# Patient Record
Sex: Male | Born: 1955 | Race: White | Hispanic: No | Marital: Married | Smoking: Never smoker
Health system: Southern US, Community
[De-identification: ages and names within clinical notes are randomized; demographics above are authoritative.]

## PROBLEM LIST (undated history)

## (undated) DIAGNOSIS — K222 Esophageal obstruction: Secondary | ICD-10-CM

## (undated) DIAGNOSIS — N200 Calculus of kidney: Secondary | ICD-10-CM

## (undated) DIAGNOSIS — I10 Essential (primary) hypertension: Secondary | ICD-10-CM

## (undated) DIAGNOSIS — E785 Hyperlipidemia, unspecified: Secondary | ICD-10-CM

## (undated) HISTORY — PX: COLONOSCOPY: SHX174

## (undated) HISTORY — PX: ESOPHAGOGASTRODUODENOSCOPY: SHX1529

---

## 2007-07-25 ENCOUNTER — Ambulatory Visit: Payer: Self-pay | Admitting: Gastroenterology

## 2010-09-08 ENCOUNTER — Ambulatory Visit: Payer: Self-pay | Admitting: Gastroenterology

## 2011-06-24 ENCOUNTER — Ambulatory Visit: Payer: Self-pay | Admitting: General Practice

## 2012-09-05 ENCOUNTER — Ambulatory Visit: Payer: Self-pay | Admitting: General Practice

## 2012-09-05 IMAGING — CR DG CHEST 2V
1 series · 2 of 2 positions shown · non-contrast
Comparison: none

REASON FOR EXAM: cough fax results [PHONE_NUMBER]
COMMENTS:

[Series 1: pa · 0.17mm/px · 2 of 2 slices shown]
[im 1/2]
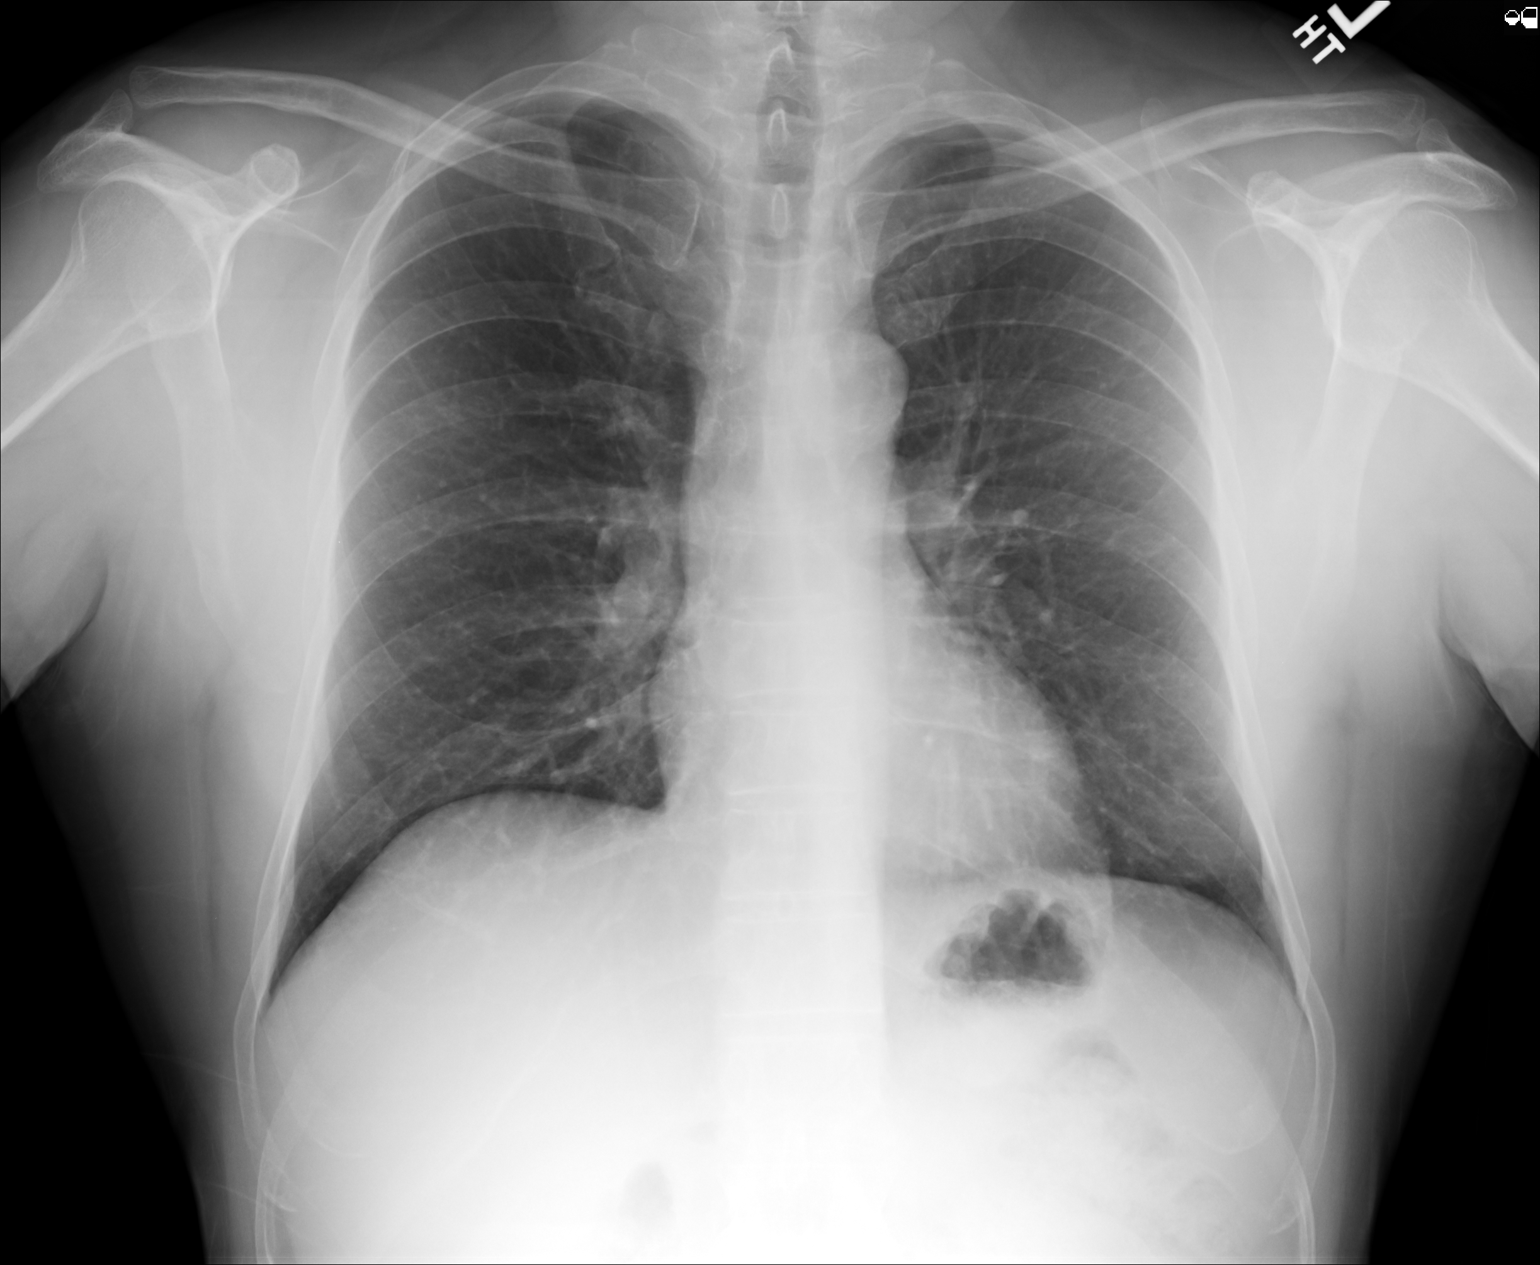
[im 2/2]
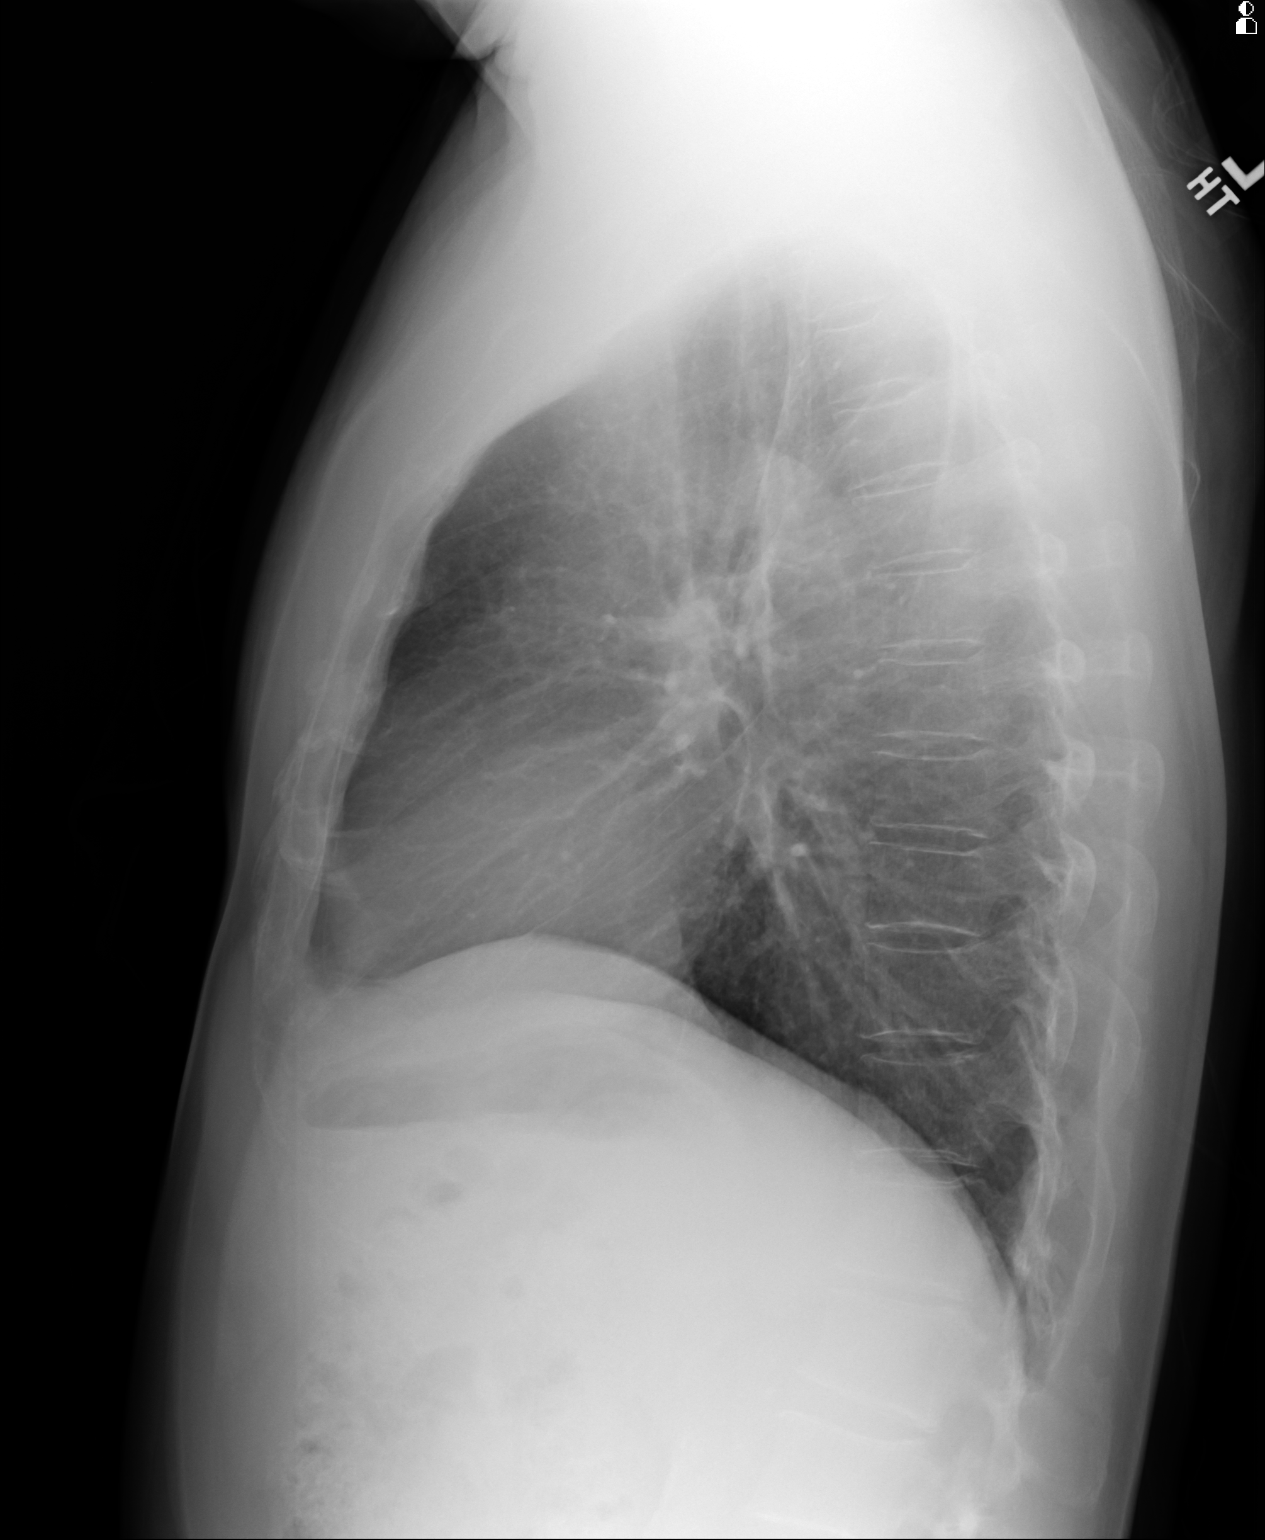

[2 of 2 positions shown; findings below may reference images not displayed]

PROCEDURE:     KDR - KDXR CHEST PA (OR AP) AND LAT  - [DATE]  [DATE]

RESULT:     Comparison is made to the study [DATE].

The lungs are adequately inflated and clear. The cardiac silhouette is
normal in size. The mediastinum is normal in width. There is no pleural
effusion or pneumothorax. The bony thorax exhibits no acute abnormality.
IMPRESSION: There is no evidence of acute cardiopulmonary abnormality.

[REDACTED]

## 2013-01-09 ENCOUNTER — Ambulatory Visit: Payer: Self-pay | Admitting: Otolaryngology

## 2013-01-18 ENCOUNTER — Ambulatory Visit: Payer: Self-pay | Admitting: Gastroenterology

## 2013-05-11 ENCOUNTER — Emergency Department: Payer: Self-pay | Admitting: General Practice

## 2014-03-23 DIAGNOSIS — M7712 Lateral epicondylitis, left elbow: Secondary | ICD-10-CM | POA: Insufficient documentation

## 2015-01-07 DIAGNOSIS — R519 Headache, unspecified: Secondary | ICD-10-CM | POA: Insufficient documentation

## 2015-07-30 ENCOUNTER — Telehealth: Payer: Self-pay | Admitting: Physician Assistant

## 2015-07-30 NOTE — Telephone Encounter (Signed)
Not sure which medication he is talking about.  Flonase or gemfibrizol?

## 2015-09-02 ENCOUNTER — Other Ambulatory Visit: Payer: Self-pay

## 2015-09-02 DIAGNOSIS — Z299 Encounter for prophylactic measures, unspecified: Secondary | ICD-10-CM

## 2015-09-02 NOTE — Progress Notes (Signed)
Patient came in to have blood test per Dr. Sampson GoonFitzgerald order.  Blood was collected in the left arm without any incident.  Pt wants results faxed to Dr. Sampson GoonFitzgerald when they are finalized.

## 2015-09-03 LAB — CMP12+LP+TP+TSH+6AC+PSA+CBC…
ALBUMIN: 4.7 g/dL (ref 3.5–5.5)
ALT: 12 IU/L (ref 0–44)
AST: 17 IU/L (ref 0–40)
Albumin/Globulin Ratio: 2.1 (ref 1.1–2.5)
Alkaline Phosphatase: 80 IU/L (ref 39–117)
BASOS: 0 %
BUN/Creatinine Ratio: 22 — ABNORMAL HIGH (ref 9–20)
BUN: 20 mg/dL (ref 6–24)
Basophils Absolute: 0 10*3/uL (ref 0.0–0.2)
Bilirubin Total: 0.4 mg/dL (ref 0.0–1.2)
CALCIUM: 10.3 mg/dL — AB (ref 8.7–10.2)
CHOLESTEROL TOTAL: 167 mg/dL (ref 100–199)
Chloride: 99 mmol/L (ref 97–106)
Chol/HDL Ratio: 4.2 ratio units (ref 0.0–5.0)
Creatinine, Ser: 0.92 mg/dL (ref 0.76–1.27)
EOS (ABSOLUTE): 0.4 10*3/uL (ref 0.0–0.4)
ESTIMATED CHD RISK: 0.8 times avg. (ref 0.0–1.0)
Eos: 6 %
FREE THYROXINE INDEX: 1.8 (ref 1.2–4.9)
GFR calc Af Amer: 106 mL/min/{1.73_m2} (ref >59)
GFR calc non Af Amer: 92 mL/min/{1.73_m2} (ref >59)
GGT: 21 IU/L (ref 0–65)
GLOBULIN, TOTAL: 2.2 g/dL (ref 1.5–4.5)
Glucose: 93 mg/dL (ref 65–99)
HDL: 40 mg/dL (ref >39)
HEMATOCRIT: 42.1 % (ref 37.5–51.0)
Hemoglobin: 14.1 g/dL (ref 12.6–17.7)
IMMATURE GRANULOCYTES: 0 %
Immature Grans (Abs): 0 10*3/uL (ref 0.0–0.1)
Iron: 130 ug/dL (ref 38–169)
LDH: 144 IU/L (ref 121–224)
LDL Calculated: 108 mg/dL — ABNORMAL HIGH (ref 0–99)
LYMPHS ABS: 1.7 10*3/uL (ref 0.7–3.1)
Lymphs: 29 %
MCH: 30.3 pg (ref 26.6–33.0)
MCHC: 33.5 g/dL (ref 31.5–35.7)
MCV: 90 fL (ref 79–97)
MONOS ABS: 0.4 10*3/uL (ref 0.1–0.9)
Monocytes: 7 %
NEUTROS ABS: 3.3 10*3/uL (ref 1.4–7.0)
NEUTROS PCT: 58 %
PHOSPHORUS: 4.1 mg/dL (ref 2.5–4.5)
POTASSIUM: 5.5 mmol/L — AB (ref 3.5–5.2)
PROSTATE SPECIFIC AG, SERUM: 0.7 ng/mL (ref 0.0–4.0)
Platelets: 436 10*3/uL — ABNORMAL HIGH (ref 150–379)
RBC: 4.66 x10E6/uL (ref 4.14–5.80)
RDW: 13.6 % (ref 12.3–15.4)
SODIUM: 139 mmol/L (ref 136–144)
T3 Uptake Ratio: 26 % (ref 24–39)
T4 TOTAL: 7 ug/dL (ref 4.5–12.0)
TOTAL PROTEIN: 6.9 g/dL (ref 6.0–8.5)
TRIGLYCERIDES: 93 mg/dL (ref 0–149)
TSH: 1.03 u[IU]/mL (ref 0.450–4.500)
Uric Acid: 7.5 mg/dL (ref 3.7–8.6)
VLDL Cholesterol Cal: 19 mg/dL (ref 5–40)
WBC: 5.7 10*3/uL (ref 3.4–10.8)

## 2015-09-03 LAB — HEPATITIS C ANTIBODY (REFLEX)

## 2015-09-03 LAB — HCV COMMENT:

## 2015-09-06 NOTE — Progress Notes (Signed)
Lab results were faxed to Dr[facility] Sampson [facility]oonFitzgerald @ [facility] per patient's request.

## 2015-09-06 NOTE — Progress Notes (Signed)
Lab results were faxed to Dr. Sampson GoonFitzgerald per patient's request.

## 2015-09-17 ENCOUNTER — Other Ambulatory Visit: Payer: Self-pay

## 2015-09-17 DIAGNOSIS — E875 Hyperkalemia: Secondary | ICD-10-CM

## 2015-09-17 NOTE — Progress Notes (Signed)
Patient came in to have blood drawn per Dr. Sampson [facility]oonFitzgerald at [facility].  Blood was drawn from right arm without any incident.

## 2015-09-18 LAB — POTASSIUM: Potassium: 5.1 mmol/L (ref 3.5–5.2)

## 2015-09-18 LAB — CALCIUM: CALCIUM: 9.7 mg/dL (ref 8.7–10.2)

## 2015-10-09 ENCOUNTER — Encounter: Payer: Self-pay | Admitting: *Deleted

## 2015-10-10 ENCOUNTER — Ambulatory Visit: Payer: PRIVATE HEALTH INSURANCE | Admitting: Certified Registered Nurse Anesthetist

## 2015-10-10 ENCOUNTER — Encounter
Admission: RE | Disposition: A | Discharge status: Home / Self Care | Payer: Self-pay | Source: Ambulatory Visit | Attending: Gastroenterology

## 2015-10-10 ENCOUNTER — Ambulatory Visit
Admission: RE | Admit: 2015-10-10 | Discharge: 2015-10-10 | Disposition: A | Discharge status: Home / Self Care | Payer: PRIVATE HEALTH INSURANCE | Source: Ambulatory Visit | Attending: Gastroenterology | Admitting: Gastroenterology

## 2015-10-10 DIAGNOSIS — E785 Hyperlipidemia, unspecified: Secondary | ICD-10-CM | POA: Insufficient documentation

## 2015-10-10 DIAGNOSIS — Z7982 Long term (current) use of aspirin: Secondary | ICD-10-CM | POA: Insufficient documentation

## 2015-10-10 DIAGNOSIS — R1310 Dysphagia, unspecified: Secondary | ICD-10-CM | POA: Insufficient documentation

## 2015-10-10 DIAGNOSIS — Z87442 Personal history of urinary calculi: Secondary | ICD-10-CM | POA: Insufficient documentation

## 2015-10-10 DIAGNOSIS — Z880 Allergy status to penicillin: Secondary | ICD-10-CM | POA: Diagnosis not present

## 2015-10-10 DIAGNOSIS — I10 Essential (primary) hypertension: Secondary | ICD-10-CM | POA: Diagnosis not present

## 2015-10-10 DIAGNOSIS — Z79899 Other long term (current) drug therapy: Secondary | ICD-10-CM | POA: Insufficient documentation

## 2015-10-10 DIAGNOSIS — Z86010 Personal history of colonic polyps: Secondary | ICD-10-CM | POA: Insufficient documentation

## 2015-10-10 HISTORY — DX: Essential (primary) hypertension: I10

## 2015-10-10 HISTORY — PX: COLONOSCOPY WITH PROPOFOL: SHX5780

## 2015-10-10 HISTORY — DX: Hyperlipidemia, unspecified: E78.5

## 2015-10-10 HISTORY — DX: Calculus of kidney: N20.0

## 2015-10-10 HISTORY — DX: Esophageal obstruction: K22.2

## 2015-10-10 HISTORY — PX: ESOPHAGOGASTRODUODENOSCOPY (EGD) WITH PROPOFOL: SHX5813

## 2015-10-10 SURGERY — COLONOSCOPY WITH PROPOFOL
Anesthesia: General

## 2015-10-10 MED ORDER — MIDAZOLAM HCL 2 MG/2ML IJ SOLN
INTRAMUSCULAR | Status: DC | PRN
  Administered 2015-10-10: 1 mg via INTRAVENOUS

## 2015-10-10 MED ORDER — SODIUM CHLORIDE 0.9 % IV SOLN
INTRAVENOUS | Status: DC
  Administered 2015-10-10: 08:00:00 via INTRAVENOUS

## 2015-10-10 MED ORDER — PROPOFOL 10 MG/ML IV BOLUS
INTRAVENOUS | Status: DC | PRN
  Administered 2015-10-10 (×3): 30 mg via INTRAVENOUS

## 2015-10-10 MED ORDER — LIDOCAINE HCL (CARDIAC) 20 MG/ML IV SOLN
INTRAVENOUS | Status: DC | PRN
  Administered 2015-10-10: 60 mg via INTRAVENOUS

## 2015-10-10 MED ORDER — GLYCOPYRROLATE 0.2 MG/ML IJ SOLN
INTRAMUSCULAR | Status: DC | PRN
  Administered 2015-10-10: 0.2 mg via INTRAVENOUS

## 2015-10-10 MED ORDER — PROPOFOL 500 MG/50ML IV EMUL
INTRAVENOUS | Status: DC | PRN
  Administered 2015-10-10: 120 ug/kg/min via INTRAVENOUS

## 2015-10-10 MED ORDER — SODIUM CHLORIDE 0.9 % IV SOLN: INTRAVENOUS | Status: DC

## 2015-10-10 NOTE — H&P (Signed)
    Primary Care Physician:  Clydie BraunFITZGERALD, DAVID, MD Primary Gastroenterologist:  Dr. Bluford Kaufmannh  Pre-Procedure History & Physical: HPI:  Ryan Bonilla is a 59 y.o. male is here for an EGD/colonoscopy.   Past Medical History  Diagnosis Date  . Esophageal stricture   . Hyperlipidemia   . Hypertension   . Kidney stones     Past Surgical History  Procedure Laterality Date  . Colonoscopy    . Esophagogastroduodenoscopy      Prior to Admission medications   Medication Sig Start Date End Date Taking? Authorizing Provider  aspirin 81 MG tablet Take 81 mg by mouth daily.   Yes Historical Provider, MD  atorvastatin (LIPITOR) 10 MG tablet Take 10 mg by mouth daily.   Yes Historical Provider, MD  fexofenadine (ALLEGRA) 180 MG tablet Take 180 mg by mouth daily.   Yes Historical Provider, MD  gemfibrozil (LOPID) 600 MG tablet Take 600 mg by mouth 2 (two) times daily before a meal.   Yes Historical Provider, MD  lisinopril (PRINIVIL,ZESTRIL) 10 MG tablet Take 10 mg by mouth daily.   Yes Historical Provider, MD    Allergies as of 09/27/2015  . (Not on File)    No family history on file.  Social History   Social History  . Marital Status: Married    Spouse Name: N/A  . Number of Children: N/A  . Years of Education: N/A   Occupational History  . Not on file.   Social History Main Topics  . Smoking status: Not on file  . Smokeless tobacco: Not on file  . Alcohol Use: Not on file  . Drug Use: Not on file  . Sexual Activity: Not on file   Other Topics Concern  . Not on file   Social History Narrative  . No narrative on file    Review of Systems: See HPI, otherwise negative ROS  Physical Exam: BP 130/80 mmHg  Pulse 93  Temp(Src) 97.6 F (36.4 C) (Tympanic)  Resp 18  Ht 5\' 6"  (1.676 m)  Wt 69.854 kg (154 lb)  BMI 24.87 kg/m2  SpO2 100% General:   Alert,  pleasant and cooperative in NAD Head:  Normocephalic and atraumatic. Neck:  Supple; no masses or thyromegaly. Lungs:   Clear throughout to auscultation.    Heart:  Regular rate and rhythm. Abdomen:  Soft, nontender and nondistended. Normal bowel sounds, without guarding, and without rebound.   Neurologic:  Alert and  oriented x4;  grossly normal neurologically.  Impression/Plan: Ryan Bonilla is here for an EGD/colonoscopy to be performed for personal hx of colon polyps, dysphagia Risks, benefits, limitations, and alternatives regarding  EGD/colonoscopy have been reviewed with the patient.  Questions have been answered.  All parties agreeable.   Ryan Bonilla, Ezzard StandingPAUL Y, MD  10/10/2015, 8:43 AM

## 2015-10-10 NOTE — Transfer of Care (Signed)
Immediate Anesthesia Transfer of Care Note  Patient: Ryan Bonilla  Procedure(s) Performed: Procedure(s): COLONOSCOPY WITH PROPOFOL (N/A) ESOPHAGOGASTRODUODENOSCOPY (EGD) WITH PROPOFOL (N/A)  Patient Location: PACU  Anesthesia Type:General  Level of Consciousness: sedated  Airway & Oxygen Therapy: Patient Spontanous Breathing and Patient connected to nasal cannula oxygen  Post-op Assessment:   Post vital signs: Reviewed and stable  Last Vitals:  Filed Vitals:   10/10/15 0751 10/10/15 0921  BP: 130/80 98/64  Pulse: 93 107  Temp: 36.4 C 35.8 C  Resp: 18 19    Complications: No apparent anesthesia complications

## 2015-10-10 NOTE — Anesthesia Postprocedure Evaluation (Signed)
Anesthesia Post Note  Patient: Ardeth SportsmanJames C Oyen  Procedure(s) Performed: Procedure(s) (LRB): COLONOSCOPY WITH PROPOFOL (N/A) ESOPHAGOGASTRODUODENOSCOPY (EGD) WITH PROPOFOL (N/A)  Patient location during evaluation: PACU Anesthesia Type: General Level of consciousness: awake and alert Pain management: satisfactory to patient Vital Signs Assessment: post-procedure vital signs reviewed and stable Respiratory status: nonlabored ventilation Cardiovascular status: stable Anesthetic complications: no    Last Vitals:  Filed Vitals:   10/10/15 0921 10/10/15 0922  BP: 98/64 98/64  Pulse: 107 106  Temp: 35.8 C 35.8 C  Resp: 19 19    Last Pain: There were no vitals filed for this visit.               VAN STAVEREN,Kearsten Ginther

## 2015-10-10 NOTE — Anesthesia Preprocedure Evaluation (Signed)
Anesthesia Evaluation  Patient identified by MRN, date of birth, ID band Patient awake    Reviewed: Allergy & Precautions, NPO status , Patient's Chart, lab work & pertinent test results  Airway Mallampati: II       Dental  (+) Teeth Intact   Pulmonary neg pulmonary ROS,    breath sounds clear to auscultation       Cardiovascular Exercise Tolerance: Good hypertension, Pt. on medications  Rhythm:Regular     Neuro/Psych    GI/Hepatic negative GI ROS, Neg liver ROS,   Endo/Other  negative endocrine ROS  Renal/GU negative Renal ROS     Musculoskeletal   Abdominal Normal abdominal exam  (+)   Peds  Hematology   Anesthesia Other Findings   Reproductive/Obstetrics                             Anesthesia Physical Anesthesia Plan  ASA: II  Anesthesia Plan: General   Post-op Pain Management:    Induction: Intravenous  Airway Management Planned: Nasal Cannula  Additional Equipment:   Intra-op Plan:   Post-operative Plan:   Informed Consent: I have reviewed the patients History and Physical, chart, labs and discussed the procedure including the risks, benefits and alternatives for the proposed anesthesia with the patient or authorized representative who has indicated his/her understanding and acceptance.     Plan Discussed with: CRNA  Anesthesia Plan Comments:         Anesthesia Quick Evaluation

## 2015-10-10 NOTE — Op Note (Signed)
[facility] Gastroenterology Patient Name: Ryan Bonilla Procedure Date: 10/10/2015 8:56 AM MRN: [id number] Account #: [id number] Date of Birth: 02-10-1956 Admit Type: Outpatient Age: 59 Room: [facility] ENDO ROOM 4 Gender: Male Note Status: Finalized Procedure:         Upper GI endoscopy Indications:       Dysphagia Providers:         Ezzard Standing. Bluford Kaufmann, MD Referring MD:      Clydie Braun (Referring MD) Medicines:         Sedation Required Anesthesia Staff Assistance, Monitored                     Anesthesia Care Complications:     No immediate complications. Procedure:         Pre-Anesthesia Assessment:                    - Prior to the procedure, a History and Physical was                     performed, and patient medications, allergies and                     sensitivities were reviewed. The patient's tolerance of                     previous anesthesia was reviewed.                    - The risks and benefits of the procedure and the sedation                     options and risks were discussed with the patient. All                     questions were answered and informed consent was obtained.                    - After reviewing the risks and benefits, the patient was                     deemed in satisfactory condition to undergo the procedure.                    After obtaining informed consent, the endoscope was passed                     under direct vision. Throughout the procedure, the                     patient's blood pressure, pulse, and oxygen saturations                     were monitored continuously. The Olympus GIF-160 endoscope                     (S#. [id number]) was introduced through the mouth, and                     advanced to the second part of duodenum. The upper GI                     endoscopy was accomplished without difficulty. The patient  tolerated the procedure well. Findings:      No endoscopic abnormality was evident  in the esophagus to explain the       patient's complaint of dysphagia. It was decided, however, to proceed       with dilation of the entire esophagus. The scope was withdrawn. Dilation       was performed with a Maloney dilator with mild resistance at 54 Fr.      The entire examined stomach was normal.      The examined duodenum was normal. Impression:        - No endoscopic esophageal abnormality to explain                     patient's dysphagia. Esophagus dilated. Dilated.                    - Normal stomach.                    - Normal examined duodenum.                    - No specimens collected. Recommendation:    - Discharge patient to home.                    - Observe patient's clinical course.                    - The findings and recommendations were discussed with the                     patient. Procedure Code(s): --- Professional ---                    [phone], Esophagogastroduodenoscopy, flexible, transoral;                     diagnostic, including collection of specimen(s) by                     brushing or washing, when performed (separate procedure)                    43450, Dilation of esophagus, by unguided sound or bougie,                     single or multiple passes Diagnosis Code(s): --- Professional ---                    R13.10, Dysphagia, unspecified CPT copyright 2014 American Medical Association. All rights reserved. The codes documented in this report are preliminary and upon coder review may  be revised to meet current compliance requirements. Wallace CullensPaul Y Willowdean Luhmann, MD 10/10/2015 9:05:55 AM This report has been signed electronically. Number of Addenda: 0 Note Initiated On: 10/10/2015 8:56 AM      [facility]

## 2015-10-10 NOTE — Op Note (Signed)
[facility] Gastroenterology Patient Name: Ryan SodaJames Parrow Procedure Date: 10/10/2015 8:56 AM MRN: [id number] Account #: [id number] Date of Birth: 08-Jan-1956 Admit Type: Outpatient Age: 59 [facility] ENDO ROOM 4 Gender: Male Note Status: Finalized Procedure:         Colonoscopy Indications:       Personal history of colonic polyps Providers:         Ezzard StandingPaul Y. Bluford Kaufmannh, MD Referring MD:      Teena Iraniavid M. Sampson GoonFitzgerald, MD (Referring MD) Medicines:         Monitored Anesthesia Care Complications:     No immediate complications. Procedure:         Pre-Anesthesia Assessment:                    - Prior to the procedure, a History and Physical was                     performed, and patient medications, allergies and                     sensitivities were reviewed. The patient's tolerance of                     previous anesthesia was reviewed.                    - The risks and benefits of the procedure and the sedation                     options and risks were discussed with the patient. All                     questions were answered and informed consent was obtained.                    - After reviewing the risks and benefits, the patient was                     deemed in satisfactory condition to undergo the procedure.                    After obtaining informed consent, the colonoscope was                     passed under direct vision. Throughout the procedure, the                     patient's blood pressure, pulse, and oxygen saturations                     were monitored continuously. The Olympus PCF-H180AL                     colonoscope ( S#: [id number] ) was introduced through the                     anus and advanced to the the cecum, identified by                     appendiceal orifice and ileocecal valve. The colonoscopy                     was performed without difficulty. The patient tolerated  the procedure well. The quality of the bowel preparation                      was good. Findings:      The colon (entire examined portion) appeared normal. Impression:        - The entire examined colon is normal.                    - No specimens collected. Recommendation:    - Discharge patient to home.                    - Repeat colonoscopy in 5 years for surveillance.                    - The findings and recommendations were discussed with the                     patient. Procedure Code(s): --- Professional ---                    [phone], Colonoscopy, flexible; diagnostic, including                     collection of specimen(s) by brushing or washing, when                     performed (separate procedure) Diagnosis Code(s): --- Professional ---                    Z86.010, Personal history of colonic polyps CPT copyright 2014 American Medical Association. All rights reserved. The codes documented in this report are preliminary and upon coder review may  be revised to meet current compliance requirements. Wallace Cullens, MD 10/10/2015 9:21:03 AM This report has been signed electronically. Number of Addenda: 0 Note Initiated On: 10/10/2015 8:56 AM Scope Withdrawal Time: 0 hours 8 minutes 39 seconds  Total Procedure Duration: 0 hours 10 minutes 44 seconds       [facility]

## 2015-10-12 ENCOUNTER — Encounter: Payer: Self-pay | Admitting: Gastroenterology

## 2015-11-07 ENCOUNTER — Ambulatory Visit: Payer: Self-pay | Admitting: Physician Assistant

## 2015-11-07 ENCOUNTER — Encounter: Payer: Self-pay | Admitting: Physician Assistant

## 2015-11-07 VITALS — BP 135/70 | HR 95 | Temp 97.9°F

## 2015-11-07 DIAGNOSIS — S60459A Superficial foreign body of unspecified finger, initial encounter: Secondary | ICD-10-CM

## 2015-11-07 MED ORDER — SULFAMETHOXAZOLE-TRIMETHOPRIM 800-160 MG PO TABS: 1.0000 | ORAL_TABLET | Freq: Two times a day (BID) | ORAL | Status: DC

## 2015-11-07 NOTE — Progress Notes (Signed)
S: c/o having a splinter in thumb, not sure its still in there, pulled a piece out, finger has been sore since then, ?if swelling, no drainage  O: vitals wnl, nad, left thumb has puncture wound along edge of cuticle, no fb noted, no drainage, n/v intact  A: foreign body left thumb, ?infected vs retained fb  P: septra ds 1 po bid x 7d, warm water soaks, if worsening will send to ortho

## 2015-12-13 ENCOUNTER — Encounter: Payer: Self-pay | Admitting: Physician Assistant

## 2015-12-13 ENCOUNTER — Ambulatory Visit: Payer: Self-pay | Admitting: Physician Assistant

## 2015-12-13 VITALS — BP 121/70 | HR 96 | Temp 97.5°F

## 2015-12-13 DIAGNOSIS — J0100 Acute maxillary sinusitis, unspecified: Secondary | ICD-10-CM

## 2015-12-13 MED ORDER — FEXOFENADINE-PSEUDOEPHED ER 60-120 MG PO TB12: 1.0000 | ORAL_TABLET | Freq: Two times a day (BID) | ORAL | Status: DC

## 2015-12-13 MED ORDER — SULFAMETHOXAZOLE-TRIMETHOPRIM 800-160 MG PO TABS: 1.0000 | ORAL_TABLET | Freq: Two times a day (BID) | ORAL | Status: DC

## 2015-12-13 NOTE — Progress Notes (Signed)
   Subjective:Sinus problems    Patient ID: Ryan Bonilla, male    DOB: 1956-04-08, 60 y.o.   MRN: [id number]  HPI Patient c/o one week of facial and ear pain. Intermitting post nasal drainage. Low grade fever and cough.Denies nausea, vomiting, or diarrhea. No palliative measures for compliant.    Review of Systems    Hypertension and Hyperlipedma. Objective:   Physical Exam Bilateral maxillary guarding, edematous bilateral TM, and post nasal drainage. Bilateral nasal turbinates. Neck supple without adenopathy, Lungs CTA, and Heart RRR.       Assessment & Plan:maxillary sinusitis Bactrim DS and Allergra-D.  Follow up as needed.

## 2016-07-13 ENCOUNTER — Ambulatory Visit: Payer: Self-pay | Admitting: Physician Assistant

## 2016-07-13 ENCOUNTER — Encounter: Payer: Self-pay | Admitting: Physician Assistant

## 2016-07-13 VITALS — BP 121/73 | HR 121 | Temp 99.3°F

## 2016-07-13 DIAGNOSIS — J09X2 Influenza due to identified novel influenza A virus with other respiratory manifestations: Secondary | ICD-10-CM

## 2016-07-13 LAB — POCT INFLUENZA A/B
INFLUENZA A, POC: POSITIVE — AB
Influenza B, POC: NEGATIVE

## 2016-07-13 MED ORDER — HYDROCOD POLST-CPM POLST ER 10-8 MG/5ML PO SUER
5.0000 mL | Freq: Two times a day (BID) | ORAL | 0 refills | Status: DC | PRN
Start: 2016-07-13 — End: 2019-12-18

## 2016-07-13 MED ORDER — OSELTAMIVIR PHOSPHATE 75 MG PO CAPS: 75.0000 mg | ORAL_CAPSULE | Freq: Two times a day (BID) | ORAL | 0 refills | Status: DC

## 2016-07-13 NOTE — Progress Notes (Signed)
S: C/o runny nose and congestion with dry cough for 2-3 days, + fever, chills, denies cp/sob, v/d;  cough is sporadic, keeping him awake at night, aches down to his bones  Using otc meds: robitussin  O: PE: vitals w elevated temp at 99.3, , nad,  perrl eomi, normocephalic, tms dull, nasal mucosa red and swollen, throat injected, neck supple no lymph, lungs c t a, cv rrr, neuro intact, flu swab +A  A:  Acute influenza A   P: drink fluids, continue regular meds , use otc meds of choice, return if not improving in 5 days, return earlier if worsening , tamiflu 75mg  bid x 5, tussionex, 150ml nr, also gave rx for family members April and [place] Mcgranahan to take preventive

## 2016-08-17 ENCOUNTER — Other Ambulatory Visit: Payer: Self-pay

## 2016-08-17 DIAGNOSIS — Z299 Encounter for prophylactic measures, unspecified: Secondary | ICD-10-CM

## 2016-08-17 NOTE — Progress Notes (Signed)
Patient came in to have blood drawn for testing per Dr[facility] Sampson [facility]Fitzgerald [facility] orders.

## 2016-08-18 LAB — CMP12+LP+TP+TSH+6AC+CBC/D/PLT
A/G RATIO: 2.3 — AB (ref 1.2–2.2)
ALBUMIN: 4.5 g/dL (ref 3.5–5.5)
ALK PHOS: 79 IU/L (ref 39–117)
ALT: 13 IU/L (ref 0–44)
AST: 13 IU/L (ref 0–40)
BASOS ABS: 0 10*3/uL (ref 0.0–0.2)
BILIRUBIN TOTAL: 0.4 mg/dL (ref 0.0–1.2)
BUN / CREAT RATIO: 21 — AB (ref 9–20)
BUN: 15 mg/dL (ref 6–24)
Basos: 1 %
CHOLESTEROL TOTAL: 161 mg/dL (ref 100–199)
Calcium: 9.6 mg/dL (ref 8.7–10.2)
Chloride: 100 mmol/L (ref 96–106)
Chol/HDL Ratio: 4.1 ratio units (ref 0.0–5.0)
Creatinine, Ser: 0.72 mg/dL — ABNORMAL LOW (ref 0.76–1.27)
EOS (ABSOLUTE): 0.7 10*3/uL — ABNORMAL HIGH (ref 0.0–0.4)
EOS: 8 %
Estimated CHD Risk: 0.8 times avg. (ref 0.0–1.0)
FREE THYROXINE INDEX: 1.5 (ref 1.2–4.9)
GFR calc Af Amer: 119 mL/min/{1.73_m2} (ref >59)
GFR calc non Af Amer: 103 mL/min/{1.73_m2} (ref >59)
GGT: 12 IU/L (ref 0–65)
Globulin, Total: 2 g/dL (ref 1.5–4.5)
Glucose: 92 mg/dL (ref 65–99)
HDL: 39 mg/dL — AB (ref >39)
HEMOGLOBIN: 13.7 g/dL (ref 12.6–17.7)
Hematocrit: 42 % (ref 37.5–51.0)
IMMATURE GRANS (ABS): 0 10*3/uL (ref 0.0–0.1)
IMMATURE GRANULOCYTES: 0 %
IRON: 59 ug/dL (ref 38–169)
LDH: 146 IU/L (ref 121–224)
LDL CALC: 99 mg/dL (ref 0–99)
LYMPHS ABS: 2.3 10*3/uL (ref 0.7–3.1)
LYMPHS: 28 %
MCH: 29.3 pg (ref 26.6–33.0)
MCHC: 32.6 g/dL (ref 31.5–35.7)
MCV: 90 fL (ref 79–97)
MONOCYTES: 7 %
Monocytes Absolute: 0.6 10*3/uL (ref 0.1–0.9)
NEUTROS PCT: 56 %
Neutrophils Absolute: 4.5 10*3/uL (ref 1.4–7.0)
PLATELETS: 395 10*3/uL — AB (ref 150–379)
Phosphorus: 4.4 mg/dL (ref 2.5–4.5)
Potassium: 5 mmol/L (ref 3.5–5.2)
RBC: 4.67 x10E6/uL (ref 4.14–5.80)
RDW: 14.3 % (ref 12.3–15.4)
Sodium: 138 mmol/L (ref 134–144)
T3 UPTAKE RATIO: 23 % — AB (ref 24–39)
T4, Total: 6.7 ug/dL (ref 4.5–12.0)
TOTAL PROTEIN: 6.5 g/dL (ref 6.0–8.5)
TRIGLYCERIDES: 117 mg/dL (ref 0–149)
TSH: 2.57 u[IU]/mL (ref 0.450–4.500)
Uric Acid: 6.2 mg/dL (ref 3.7–8.6)
VLDL CHOLESTEROL CAL: 23 mg/dL (ref 5–40)
WBC: 8.1 10*3/uL (ref 3.4–10.8)

## 2016-08-18 LAB — HGB A1C W/O EAG: Hgb A1c MFr Bld: 6 % — ABNORMAL HIGH (ref 4.8–5.6)

## 2017-02-23 ENCOUNTER — Other Ambulatory Visit: Payer: Self-pay | Admitting: Emergency Medicine

## 2017-02-23 MED ORDER — FLUTICASONE PROPIONATE 50 MCG/ACT NA SUSP: 2.0000 | Freq: Every day | NASAL | 6 refills | Status: DC

## 2017-02-23 NOTE — Telephone Encounter (Signed)
Med refill for flonase approved, rx was sent to [facility]

## 2017-04-20 ENCOUNTER — Ambulatory Visit: Payer: Self-pay | Admitting: Physician Assistant

## 2017-04-20 ENCOUNTER — Encounter: Payer: Self-pay | Admitting: Physician Assistant

## 2017-04-20 VITALS — BP 110/70 | HR 86 | Temp 98.5°F

## 2017-04-20 DIAGNOSIS — Z20818 Contact with and (suspected) exposure to other bacterial communicable diseases: Secondary | ICD-10-CM

## 2017-04-20 MED ORDER — AZITHROMYCIN 250 MG PO TABS: ORAL_TABLET | ORAL | 0 refills | Status: DC

## 2017-04-20 NOTE — Progress Notes (Signed)
S: is going out of town in a few days, his wife was dx with strep throat, he started feeling like he has a scratchy throat this morning, no fever/chills, no sinus drainage, using nasal sprays  O: vitals wnl, nad, tms clear, nasal mucosa wnl, throat wnl, neck supple no lymph, lungs c t a, cv rrr  A: exposure to strep, travel med  P: zpack

## 2017-05-24 ENCOUNTER — Other Ambulatory Visit: Payer: Self-pay | Admitting: Physician Assistant

## 2017-05-24 NOTE — Telephone Encounter (Signed)
Med refill for flonase, pt gets by mail order, 90 day supply with 3 refills

## 2017-08-30 ENCOUNTER — Other Ambulatory Visit: Payer: Self-pay

## 2017-08-30 DIAGNOSIS — Z299 Encounter for prophylactic measures, unspecified: Secondary | ICD-10-CM

## 2017-08-30 NOTE — Progress Notes (Signed)
Patient came in to have blood drawn for testing per Dr. Jarrett AblesFitzgerald's orders.

## 2017-08-31 LAB — CMP12+LP+TP+TSH+6AC+CBC/D/PLT
ALK PHOS: 82 IU/L (ref 39–117)
ALT: 15 IU/L (ref 0–44)
AST: 17 IU/L (ref 0–40)
Albumin/Globulin Ratio: 2 (ref 1.2–2.2)
Albumin: 4.7 g/dL (ref 3.5–5.5)
BASOS ABS: 0 10*3/uL (ref 0.0–0.2)
BUN/Creatinine Ratio: 19 (ref 9–20)
BUN: 17 mg/dL (ref 6–24)
Basos: 0 %
Bilirubin Total: 0.3 mg/dL (ref 0.0–1.2)
CALCIUM: 9.7 mg/dL (ref 8.7–10.2)
CHOL/HDL RATIO: 4.4 ratio (ref 0.0–5.0)
Chloride: 102 mmol/L (ref 96–106)
Cholesterol, Total: 190 mg/dL (ref 100–199)
Creatinine, Ser: 0.9 mg/dL (ref 0.76–1.27)
EOS (ABSOLUTE): 0.6 10*3/uL — AB (ref 0.0–0.4)
Eos: 7 %
Estimated CHD Risk: 0.9 times avg. (ref 0.0–1.0)
Free Thyroxine Index: 1.8 (ref 1.2–4.9)
GFR calc non Af Amer: 93 mL/min/{1.73_m2} (ref >59)
GFR, EST AFRICAN AMERICAN: 108 mL/min/{1.73_m2} (ref >59)
GGT: 20 IU/L (ref 0–65)
GLOBULIN, TOTAL: 2.3 g/dL (ref 1.5–4.5)
Glucose: 97 mg/dL (ref 65–99)
HDL: 43 mg/dL (ref >39)
Hematocrit: 37.2 % — ABNORMAL LOW (ref 37.5–51.0)
Hemoglobin: 12.1 g/dL — ABNORMAL LOW (ref 13.0–17.7)
IMMATURE GRANS (ABS): 0 10*3/uL (ref 0.0–0.1)
Immature Granulocytes: 0 %
Iron: 44 ug/dL (ref 38–169)
LDH: 177 IU/L (ref 121–224)
LDL CALC: 123 mg/dL — AB (ref 0–99)
LYMPHS: 21 %
Lymphocytes Absolute: 1.7 10*3/uL (ref 0.7–3.1)
MCH: 25.7 pg — ABNORMAL LOW (ref 26.6–33.0)
MCHC: 32.5 g/dL (ref 31.5–35.7)
MCV: 79 fL (ref 79–97)
MONOCYTES: 6 %
MONOS ABS: 0.5 10*3/uL (ref 0.1–0.9)
NEUTROS PCT: 66 %
Neutrophils Absolute: 5.3 10*3/uL (ref 1.4–7.0)
PLATELETS: 547 10*3/uL — AB (ref 150–379)
POTASSIUM: 5.2 mmol/L (ref 3.5–5.2)
Phosphorus: 3.7 mg/dL (ref 2.5–4.5)
RBC: 4.7 x10E6/uL (ref 4.14–5.80)
RDW: 15.5 % — AB (ref 12.3–15.4)
Sodium: 139 mmol/L (ref 134–144)
T3 Uptake Ratio: 23 % — ABNORMAL LOW (ref 24–39)
T4, Total: 7.7 ug/dL (ref 4.5–12.0)
TRIGLYCERIDES: 122 mg/dL (ref 0–149)
TSH: 1.47 u[IU]/mL (ref 0.450–4.500)
Total Protein: 7 g/dL (ref 6.0–8.5)
Uric Acid: 7.5 mg/dL (ref 3.7–8.6)
VLDL Cholesterol Cal: 24 mg/dL (ref 5–40)
WBC: 8.1 10*3/uL (ref 3.4–10.8)

## 2017-08-31 LAB — HGB A1C W/O EAG: HEMOGLOBIN A1C: 6.2 % — AB (ref 4.8–5.6)

## 2017-09-13 ENCOUNTER — Other Ambulatory Visit: Payer: Self-pay | Admitting: Physician Assistant

## 2017-09-13 VITALS — BP 125/70 | HR 80 | Temp 98.3°F | Resp 16

## 2017-09-13 DIAGNOSIS — Z299 Encounter for prophylactic measures, unspecified: Secondary | ICD-10-CM

## 2017-09-13 NOTE — Progress Notes (Signed)
Patient came in to have blood drawn for testing per Dr.David Fitzgerald's order.

## 2017-09-14 LAB — CBC WITH DIFFERENTIAL/PLATELET
BASOS ABS: 0 10*3/uL (ref 0.0–0.2)
Basos: 0 %
EOS (ABSOLUTE): 0.4 10*3/uL (ref 0.0–0.4)
Eos: 5 %
Hematocrit: 38.1 % (ref 37.5–51.0)
Hemoglobin: 12 g/dL — ABNORMAL LOW (ref 13.0–17.7)
IMMATURE GRANS (ABS): 0 10*3/uL (ref 0.0–0.1)
IMMATURE GRANULOCYTES: 0 %
LYMPHS: 26 %
Lymphocytes Absolute: 2.1 10*3/uL (ref 0.7–3.1)
MCH: 24.9 pg — ABNORMAL LOW (ref 26.6–33.0)
MCHC: 31.5 g/dL (ref 31.5–35.7)
MCV: 79 fL (ref 79–97)
MONOS ABS: 0.5 10*3/uL (ref 0.1–0.9)
Monocytes: 6 %
NEUTROS PCT: 63 %
Neutrophils Absolute: 4.9 10*3/uL (ref 1.4–7.0)
PLATELETS: 494 10*3/uL — AB (ref 150–379)
RBC: 4.82 x10E6/uL (ref 4.14–5.80)
RDW: 15.3 % (ref 12.3–15.4)
WBC: 7.8 10*3/uL (ref 3.4–10.8)

## 2017-09-14 LAB — HEPATITIS C ANTIBODY (REFLEX)

## 2017-09-14 LAB — FERRITIN: FERRITIN: 8 ng/mL — AB (ref 30–400)

## 2017-09-14 LAB — HCV COMMENT:

## 2017-09-14 LAB — VITAMIN B12: VITAMIN B 12: 161 pg/mL — AB (ref 232–1245)

## 2017-09-14 NOTE — Progress Notes (Signed)
Here is a copy of your patient's labs that were drawn in the employee [facility] 

## 2017-10-20 ENCOUNTER — Ambulatory Visit: Payer: Self-pay | Admitting: Emergency Medicine

## 2017-10-20 DIAGNOSIS — Z4802 Encounter for removal of sutures: Secondary | ICD-10-CM

## 2017-10-20 DIAGNOSIS — Z299 Encounter for prophylactic measures, unspecified: Secondary | ICD-10-CM

## 2017-10-20 NOTE — Progress Notes (Addendum)
Previous note deleted. The note was dictated on the wrong patient.

## 2017-10-20 NOTE — Progress Notes (Addendum)
Note dictated on the wrong patient's chart. Note deleted.

## 2017-10-20 NOTE — Addendum Note (Signed)
Addended by: Catha BrowEACON, Basya Casavant T on: 10/20/2017 02:46 PM   Modules accepted: Orders

## 2017-10-20 NOTE — Progress Notes (Signed)
Patient came in to have blood drawn for testing Molly MaduroRobert Tumey's orders.

## 2017-10-21 LAB — CBC WITH DIFFERENTIAL/PLATELET
BASOS: 0 %
Basophils Absolute: 0 10*3/uL (ref 0.0–0.2)
EOS (ABSOLUTE): 0.4 10*3/uL (ref 0.0–0.4)
EOS: 5 %
HEMATOCRIT: 42.8 % (ref 37.5–51.0)
HEMOGLOBIN: 13.9 g/dL (ref 13.0–17.7)
IMMATURE GRANS (ABS): 0 10*3/uL (ref 0.0–0.1)
IMMATURE GRANULOCYTES: 0 %
LYMPHS: 24 %
Lymphocytes Absolute: 1.8 10*3/uL (ref 0.7–3.1)
MCH: 26.9 pg (ref 26.6–33.0)
MCHC: 32.5 g/dL (ref 31.5–35.7)
MCV: 83 fL (ref 79–97)
Monocytes Absolute: 0.4 10*3/uL (ref 0.1–0.9)
Monocytes: 5 %
NEUTROS PCT: 66 %
Neutrophils Absolute: 4.9 10*3/uL (ref 1.4–7.0)
Platelets: 410 10*3/uL — ABNORMAL HIGH (ref 150–379)
RBC: 5.16 x10E6/uL (ref 4.14–5.80)
RDW: 18.9 % — ABNORMAL HIGH (ref 12.3–15.4)
WBC: 7.5 10*3/uL (ref 3.4–10.8)

## 2017-10-21 LAB — COMPREHENSIVE METABOLIC PANEL
A/G RATIO: 2.1 (ref 1.2–2.2)
ALT: 20 IU/L (ref 0–44)
AST: 13 IU/L (ref 0–40)
Albumin: 4.7 g/dL (ref 3.5–5.5)
Alkaline Phosphatase: 96 IU/L (ref 39–117)
BUN/Creatinine Ratio: 20 (ref 9–20)
BUN: 18 mg/dL (ref 6–24)
Bilirubin Total: <0.2 mg/dL (ref 0.0–1.2)
CALCIUM: 9.9 mg/dL (ref 8.7–10.2)
CO2: 21 mmol/L (ref 20–29)
Chloride: 101 mmol/L (ref 96–106)
Creatinine, Ser: 0.89 mg/dL (ref 0.76–1.27)
GFR, EST AFRICAN AMERICAN: 108 mL/min/{1.73_m2} (ref >59)
GFR, EST NON AFRICAN AMERICAN: 94 mL/min/{1.73_m2} (ref >59)
GLOBULIN, TOTAL: 2.2 g/dL (ref 1.5–4.5)
Glucose: 152 mg/dL — ABNORMAL HIGH (ref 65–99)
POTASSIUM: 5 mmol/L (ref 3.5–5.2)
SODIUM: 139 mmol/L (ref 134–144)
TOTAL PROTEIN: 6.9 g/dL (ref 6.0–8.5)

## 2017-10-25 ENCOUNTER — Other Ambulatory Visit: Payer: Self-pay | Admitting: Internal Medicine

## 2017-10-25 DIAGNOSIS — R1013 Epigastric pain: Secondary | ICD-10-CM

## 2017-10-29 ENCOUNTER — Ambulatory Visit: Payer: PRIVATE HEALTH INSURANCE

## 2017-11-02 ENCOUNTER — Ambulatory Visit
Admission: RE | Admit: 2017-11-02 | Discharge: 2017-11-02 | Disposition: A | Discharge status: Home / Self Care | Payer: Managed Care, Other (non HMO) | Source: Ambulatory Visit | Attending: Internal Medicine | Admitting: Internal Medicine

## 2017-11-02 DIAGNOSIS — R1013 Epigastric pain: Secondary | ICD-10-CM

## 2017-11-04 ENCOUNTER — Ambulatory Visit
Admission: RE | Admit: 2017-11-04 | Discharge: 2017-11-04 | Disposition: A | Discharge status: Home / Self Care | Payer: Managed Care, Other (non HMO) | Source: Ambulatory Visit | Attending: Internal Medicine | Admitting: Internal Medicine

## 2017-11-04 DIAGNOSIS — R1013 Epigastric pain: Secondary | ICD-10-CM | POA: Insufficient documentation

## 2017-11-24 DIAGNOSIS — D500 Iron deficiency anemia secondary to blood loss (chronic): Secondary | ICD-10-CM | POA: Insufficient documentation

## 2017-11-24 DIAGNOSIS — E538 Deficiency of other specified B group vitamins: Secondary | ICD-10-CM | POA: Insufficient documentation

## 2018-02-23 DIAGNOSIS — R9389 Abnormal findings on diagnostic imaging of other specified body structures: Secondary | ICD-10-CM | POA: Insufficient documentation

## 2018-09-13 ENCOUNTER — Emergency Department: Payer: Managed Care, Other (non HMO)

## 2018-09-13 ENCOUNTER — Emergency Department
Admission: EM | Admit: 2018-09-13 | Discharge: 2018-09-13 | Disposition: A | Discharge status: Home / Self Care | Payer: Managed Care, Other (non HMO) | Attending: Emergency Medicine | Admitting: Emergency Medicine

## 2018-09-13 ENCOUNTER — Encounter: Payer: Self-pay | Admitting: Emergency Medicine

## 2018-09-13 ENCOUNTER — Other Ambulatory Visit: Payer: Self-pay

## 2018-09-13 DIAGNOSIS — Z79899 Other long term (current) drug therapy: Secondary | ICD-10-CM | POA: Insufficient documentation

## 2018-09-13 DIAGNOSIS — R079 Chest pain, unspecified: Secondary | ICD-10-CM | POA: Insufficient documentation

## 2018-09-13 DIAGNOSIS — R000 Tachycardia, unspecified: Secondary | ICD-10-CM | POA: Insufficient documentation

## 2018-09-13 DIAGNOSIS — I10 Essential (primary) hypertension: Secondary | ICD-10-CM | POA: Diagnosis not present

## 2018-09-13 DIAGNOSIS — Z7982 Long term (current) use of aspirin: Secondary | ICD-10-CM | POA: Insufficient documentation

## 2018-09-13 LAB — CBC
HCT: 45.6 % (ref 39.0–52.0)
HEMOGLOBIN: 15.7 g/dL (ref 13.0–17.0)
MCH: 30.8 pg (ref 26.0–34.0)
MCHC: 34.4 g/dL (ref 30.0–36.0)
MCV: 89.4 fL (ref 80.0–100.0)
Platelets: 440 10*3/uL — ABNORMAL HIGH (ref 150–400)
RBC: 5.1 MIL/uL (ref 4.22–5.81)
RDW: 13 % (ref 11.5–15.5)
WBC: 7 10*3/uL (ref 4.0–10.5)
nRBC: 0 % (ref 0.0–0.2)

## 2018-09-13 LAB — TROPONIN I: Troponin I: <0.03 ng/mL (ref <0.03)

## 2018-09-13 LAB — BASIC METABOLIC PANEL
ANION GAP: 9 (ref 5–15)
BUN: 17 mg/dL (ref 6–20)
CO2: 25 mmol/L (ref 22–32)
Calcium: 10 mg/dL (ref 8.9–10.3)
Chloride: 103 mmol/L (ref 98–111)
Creatinine, Ser: 0.83 mg/dL (ref 0.61–1.24)
GFR calc Af Amer: >60 mL/min (ref >60)
Glucose, Bld: 100 mg/dL — ABNORMAL HIGH (ref 70–99)
POTASSIUM: 3.7 mmol/L (ref 3.5–5.1)
SODIUM: 137 mmol/L (ref 135–145)

## 2018-09-13 MED ORDER — SODIUM CHLORIDE 0.9 % IV BOLUS
1000.0000 mL | Freq: Once | INTRAVENOUS | Status: AC
  Administered 2018-09-13: 1000 mL via INTRAVENOUS

## 2018-09-13 NOTE — ED Provider Notes (Signed)
[facility] Emergency Department Provider Note  Time seen: 6:29 PM  I have reviewed the triage vital signs and the nursing notes.   HISTORY  Chief Complaint Tachycardia    HPI Ryan Bonilla is a 62 y.o. male with a past medical history of hypertension, hyperlipidemia, esophageal strictures, presents to the emergency department for acute onset of diaphoresis, felt like his blood pressure increased and dizziness.  According to the patient around 4 PM today he had a sudden onset of rapid heart rate, felt dizzy and somewhat diaphoretic.  Denies any chest pain, although states at one point he felt some discomfort, I continued to press him on his issue but he says he never developed any pain in the chest it was more lightheaded and felt like his blood pressure spiked up.  States he [facility] to the jail nurse who checked his blood pressure which was slightly elevated and the heart rate was around 125 and told the patient to come to the emergency department which he did.  Patient denies any discomfort currently, states he is feeling much better, heart rate is currently 98 bpm.  Vitals are otherwise nonrevealing.  Denies any cardiac history, states a negative stress test 2 years ago.   Past Medical History:  Diagnosis Date  . Esophageal stricture   . Hyperlipidemia   . Hypertension   . Kidney stones     There are no active problems to display for this patient.   Past Surgical History:  Procedure Laterality Date  . COLONOSCOPY    . COLONOSCOPY WITH PROPOFOL N/A 10/10/2015   Procedure: COLONOSCOPY WITH PROPOFOL;  Surgeon: Wallace Cullens, MD;  Location: [facility] ENDOSCOPY;  Service: Gastroenterology;  Laterality: N/A;  . ESOPHAGOGASTRODUODENOSCOPY    . ESOPHAGOGASTRODUODENOSCOPY (EGD) WITH PROPOFOL N/A 10/10/2015   Procedure: ESOPHAGOGASTRODUODENOSCOPY (EGD) WITH PROPOFOL;  Surgeon: Wallace Cullens, MD;  Location: [facility] ENDOSCOPY;  Service: Gastroenterology;  Laterality: N/A;    Prior  to Admission medications   Medication Sig Start Date End Date Taking? Authorizing Provider  aspirin 81 MG tablet Take 81 mg by mouth daily.    [provider]  atorvastatin (LIPITOR) 10 MG tablet Take 10 mg by mouth daily.    [provider]  azithromycin (ZITHROMAX Z-PAK) 250 MG tablet 2 pills today then 1 pill a day for 4 days 04/20/17   Faythe Ghee, PA-C  chlorpheniramine-HYDROcodone [facility] PENNKINETIC ER) 10-8 MG/5ML SUER Take 5 mLs by mouth every 12 (twelve) hours as needed for cough. 07/13/16   Fisher, Roselyn Bering, PA-C  fexofenadine (ALLEGRA) 180 MG tablet Take 180 mg by mouth daily.    [provider]  fexofenadine-pseudoephedrine (ALLEGRA-D) 60-120 MG 12 hr tablet Take 1 tablet by mouth 2 (two) times daily. 12/13/15   Joni Reining, PA-C  fluticasone [facility]) 50 MCG/ACT nasal spray USE 2 SPRAYS IN [facility]  NOSTRIL DAILY 05/24/17   Sherrie Mustache Roselyn Bering, PA-C  gemfibrozil (LOPID) 600 MG tablet Take 600 mg by mouth 2 (two) times daily before a meal.    [provider]  lisinopril (PRINIVIL,ZESTRIL) 10 MG tablet Take 10 mg by mouth daily.    [provider]  oseltamivir (TAMIFLU) 75 MG capsule Take 1 capsule (75 mg total) by mouth 2 (two) times daily. 07/13/16   Fisher, Roselyn Bering, PA-C  sulfamethoxazole-trimethoprim (BACTRIM DS,SEPTRA DS) 800-160 MG tablet Take 1 tablet by mouth 2 (two) times daily. Patient not taking: Reported on 07/13/2016 12/13/15   Joni Reining, PA-C  Allergies  Allergen Reactions  . Penicillins     No family history on file.  Social History Social History   Tobacco Use  . Smoking status: Never Smoker  . Smokeless tobacco: Never Used  Substance Use Topics  . Alcohol use: Not on file  . Drug use: Not on file    Review of Systems Constitutional: Negative for fever. Cardiovascular: Negative for chest pain. Respiratory: Negative for shortness of breath. Gastrointestinal: Negative for abdominal pain, vomiting   Musculoskeletal: Negative for musculoskeletal complaints Skin: Negative for skin complaints  Neurological: Negative for headache All other ROS negative  ____________________________________________   PHYSICAL EXAM:  VITAL SIGNS: ED Triage Vitals  Enc Vitals Group     BP 09/13/18 1645 (!) 146/106     Pulse Rate 09/13/18 1645 (!) 116     Resp 09/13/18 1645 (!) 26     Temp 09/13/18 1645 97.6 F (36.4 C)     Temp Source 09/13/18 1645 Oral     SpO2 09/13/18 1645 98 %     Weight --      Height --      Head Circumference --      Peak Flow --      Pain Score 09/13/18 1644 0     Pain Loc --      Pain Edu? --      Excl. in GC? --    Constitutional: Alert and oriented. Well appearing and in no distress. Eyes: Normal exam ENT   Head: Normocephalic and atraumatic.   Mouth/Throat: Mucous membranes are moist. Cardiovascular: Normal rate, regular rhythm around 100 bpm.  No murmur. Respiratory: Normal respiratory effort without tachypnea nor retractions. Breath sounds are clear and equal bilaterally. No wheezes/rales/rhonchi. Gastrointestinal: Soft and nontender. No distention.   Musculoskeletal: Nontender with normal range of motion in all extremities. No lower extremity tenderness or edema. Neurologic:  Normal speech and language. No gross focal neurologic deficits are appreciated. Skin:  Skin is warm, dry and intact.  Psychiatric: Mood and affect are normal. Speech and behavior are normal.   ____________________________________________    EKG  EKG reviewed and interpreted by myself shows a sinus tachycardia 115 bpm with a narrow QRS, normal axis, normal intervals, nonspecific but no concerning ST changes.  ____________________________________________    RADIOLOGY  Chest x-ray shows atelectasis otherwise negative  ____________________________________________   INITIAL IMPRESSION / ASSESSMENT AND PLAN / ED COURSE  Pertinent labs & imaging results that were  available during my care of the patient were reviewed by me and considered in my medical decision making (see chart for details).  Patient presents to the emergency department for dizziness, diaphoresis, elevated blood pressure and elevated heart rate.  Differential would include ACS, anxiety, metabolic abnormality.  We will check labs, chest x-ray and continue to closely monitor.  EKG shows no concerning findings at this time.  Chest x-ray is largely nonrevealing labs are largely within normal limits including a negative troponin.  We will repeat a troponin at 745.  If repeat troponin is negative I anticipate likely discharge home as the patient is currently asymptomatic with cardiology follow-up.  Patient agreeable to plan of care.  Repeat troponin is negative.  We will discharge with cardiology follow-up.  Patient remains asymptomatic.  Heart rate currently 83 bpm.  Very well-appearing.  ____________________________________________   FINAL CLINICAL IMPRESSION(S) / ED DIAGNOSES  Tachycardia Hypertension Dizziness    Minna AntisPaduchowski, Jhace Fennell, MD 09/13/18 2124

## 2018-09-13 NOTE — ED Triage Notes (Signed)
Pt to ED from work with c/o sudden onset of CP and rapid HR. 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 PT appears diaphoretic . Denies pain at this time but is tachpnic. No cardiac hx

## 2018-09-14 ENCOUNTER — Other Ambulatory Visit: Payer: Self-pay | Admitting: Registered Nurse

## 2019-11-28 ENCOUNTER — Other Ambulatory Visit: Payer: Self-pay | Admitting: Gastroenterology

## 2019-11-28 DIAGNOSIS — R1013 Epigastric pain: Secondary | ICD-10-CM

## 2019-11-28 DIAGNOSIS — R112 Nausea with vomiting, unspecified: Secondary | ICD-10-CM

## 2019-11-29 ENCOUNTER — Other Ambulatory Visit: Payer: Self-pay

## 2019-11-29 ENCOUNTER — Ambulatory Visit
Admission: RE | Admit: 2019-11-29 | Discharge: 2019-11-29 | Disposition: A | Discharge status: Home / Self Care | Payer: Managed Care, Other (non HMO) | Source: Ambulatory Visit | Attending: Gastroenterology | Admitting: Gastroenterology

## 2019-11-29 ENCOUNTER — Other Ambulatory Visit: Payer: Self-pay | Admitting: Gastroenterology

## 2019-11-29 DIAGNOSIS — R1013 Epigastric pain: Secondary | ICD-10-CM

## 2019-11-29 DIAGNOSIS — R112 Nausea with vomiting, unspecified: Secondary | ICD-10-CM | POA: Insufficient documentation

## 2019-12-18 ENCOUNTER — Emergency Department
Admission: EM | Admit: 2019-12-18 | Discharge: 2019-12-18 | Disposition: A | Discharge status: Home / Self Care | Payer: Managed Care, Other (non HMO) | Attending: Emergency Medicine | Admitting: Emergency Medicine

## 2019-12-18 ENCOUNTER — Emergency Department: Payer: Managed Care, Other (non HMO)

## 2019-12-18 ENCOUNTER — Other Ambulatory Visit: Payer: Self-pay

## 2019-12-18 DIAGNOSIS — Z7982 Long term (current) use of aspirin: Secondary | ICD-10-CM | POA: Diagnosis not present

## 2019-12-18 DIAGNOSIS — Z79899 Other long term (current) drug therapy: Secondary | ICD-10-CM | POA: Insufficient documentation

## 2019-12-18 DIAGNOSIS — R109 Unspecified abdominal pain: Secondary | ICD-10-CM | POA: Diagnosis present

## 2019-12-18 DIAGNOSIS — K529 Noninfective gastroenteritis and colitis, unspecified: Secondary | ICD-10-CM | POA: Insufficient documentation

## 2019-12-18 DIAGNOSIS — I10 Essential (primary) hypertension: Secondary | ICD-10-CM | POA: Diagnosis not present

## 2019-12-18 LAB — COMPREHENSIVE METABOLIC PANEL
ALT: 18 U/L (ref 0–44)
AST: 18 U/L (ref 15–41)
Albumin: 4.3 g/dL (ref 3.5–5.0)
Alkaline Phosphatase: 73 U/L (ref 38–126)
Anion gap: 6 (ref 5–15)
BUN: 17 mg/dL (ref 8–23)
CO2: 28 mmol/L (ref 22–32)
Calcium: 9.5 mg/dL (ref 8.9–10.3)
Chloride: 104 mmol/L (ref 98–111)
Creatinine, Ser: 0.93 mg/dL (ref 0.61–1.24)
GFR calc Af Amer: >60 mL/min (ref >60)
GFR calc non Af Amer: >60 mL/min (ref >60)
Glucose, Bld: 149 mg/dL — ABNORMAL HIGH (ref 70–99)
Potassium: 3.9 mmol/L (ref 3.5–5.1)
Sodium: 138 mmol/L (ref 135–145)
Total Bilirubin: 0.7 mg/dL (ref 0.3–1.2)
Total Protein: 7.9 g/dL (ref 6.5–8.1)

## 2019-12-18 LAB — CBC
HCT: 44.5 % (ref 39.0–52.0)
Hemoglobin: 15.4 g/dL (ref 13.0–17.0)
MCH: 30.2 pg (ref 26.0–34.0)
MCHC: 34.6 g/dL (ref 30.0–36.0)
MCV: 87.3 fL (ref 80.0–100.0)
Platelets: 452 10*3/uL — ABNORMAL HIGH (ref 150–400)
RBC: 5.1 MIL/uL (ref 4.22–5.81)
RDW: 12.7 % (ref 11.5–15.5)
WBC: 11.3 10*3/uL — ABNORMAL HIGH (ref 4.0–10.5)
nRBC: 0 % (ref 0.0–0.2)

## 2019-12-18 LAB — URINALYSIS, COMPLETE (UACMP) WITH MICROSCOPIC
Bacteria, UA: NONE SEEN
Bilirubin Urine: NEGATIVE
Glucose, UA: NEGATIVE mg/dL
Hgb urine dipstick: NEGATIVE
Ketones, ur: NEGATIVE mg/dL
Leukocytes,Ua: NEGATIVE
Nitrite: NEGATIVE
Protein, ur: NEGATIVE mg/dL
Specific Gravity, Urine: 1.027 (ref 1.005–1.030)
Squamous Epithelial / LPF: NONE SEEN (ref 0–5)
WBC, UA: NONE SEEN WBC/hpf (ref 0–5)
pH: 5 (ref 5.0–8.0)

## 2019-12-18 LAB — LIPASE, BLOOD: Lipase: 38 U/L (ref 11–51)

## 2019-12-18 MED ORDER — METOCLOPRAMIDE HCL 5 MG/ML IJ SOLN
10.0000 mg | Freq: Once | INTRAMUSCULAR | Status: AC
  Administered 2019-12-18: 10 mg via INTRAVENOUS
  Filled 2019-12-18: qty 2

## 2019-12-18 MED ORDER — ALUM & MAG HYDROXIDE-SIMETH 200-200-20 MG/5ML PO SUSP
30.0000 mL | Freq: Once | ORAL | Status: AC
  Administered 2019-12-18: 30 mL via ORAL
  Filled 2019-12-18: qty 30

## 2019-12-18 MED ORDER — ONDANSETRON 4 MG PO TBDP: 4.0000 mg | ORAL_TABLET | Freq: Three times a day (TID) | ORAL | 0 refills | Status: DC | PRN

## 2019-12-18 MED ORDER — FAMOTIDINE IN NACL 20-0.9 MG/50ML-% IV SOLN
20.0000 mg | Freq: Once | INTRAVENOUS | Status: AC
  Administered 2019-12-18: 20:00:00 20 mg via INTRAVENOUS
  Filled 2019-12-18: qty 50

## 2019-12-18 MED ORDER — IOHEXOL 300 MG/ML  SOLN
100.0000 mL | Freq: Once | INTRAMUSCULAR | Status: AC | PRN
  Administered 2019-12-18: 100 mL via INTRAVENOUS

## 2019-12-18 NOTE — ED Provider Notes (Signed)
[facility] Emergency Department Provider Note  ____________________________________________  Time seen: Approximately 8:38 PM  I have reviewed the triage vital signs and the nursing notes.   HISTORY  Chief Complaint Abdominal Pain    HPI CHARLIS Bonilla is a 64 y.o. male with a past history of kidney stones, hypertension, hyperlipidemia who comes the ED complaining of abdominal pain that started this morning, colicky, waxing and waning, no aggravating or alleviating factors.  Generalized, nonradiating.  Patient reports that he has had 2 prior episodes of pain like this, both of which happened after eating spicy food.  However, he has not eaten any spicy food recently.  Denies a history of autoimmune disorder or inflammatory bowel disease or any family history of such.   Denies vomiting.  Reports that for the past several months he has had frequent small-volume loose bowel movements.  Denies frank constipation or obstipation.   Past Medical History:  Diagnosis Date  . Esophageal stricture   . Hyperlipidemia   . Hypertension   . Kidney stones      There are no problems to display for this patient.    Past Surgical History:  Procedure Laterality Date  . COLONOSCOPY    . COLONOSCOPY WITH PROPOFOL N/A 10/10/2015   Procedure: COLONOSCOPY WITH PROPOFOL;  Surgeon: Wallace Cullens, MD;  Location: [facility] ENDOSCOPY;  Service: Gastroenterology;  Laterality: N/A;  . ESOPHAGOGASTRODUODENOSCOPY    . ESOPHAGOGASTRODUODENOSCOPY (EGD) WITH PROPOFOL N/A 10/10/2015   Procedure: ESOPHAGOGASTRODUODENOSCOPY (EGD) WITH PROPOFOL;  Surgeon: Wallace Cullens, MD;  Location: [facility] ENDOSCOPY;  Service: Gastroenterology;  Laterality: N/A;     Prior to Admission medications   Medication Sig Start Date End Date Taking? Authorizing Provider  aspirin 81 MG tablet Take 81 mg by mouth daily.   Yes [provider]  atorvastatin (LIPITOR) 10 MG tablet Take 10 mg by mouth daily.   Yes  [provider]  fluticasone (FLONASE) 50 MCG/ACT nasal spray USE 2 SPRAYS IN EACH  NOSTRIL DAILY Patient taking differently: Place 2 sprays into both nostrils daily.  05/24/17  Yes Fisher, Roselyn Bering, PA-C  gemfibrozil (LOPID) 600 MG tablet Take 600 mg by mouth 2 (two) times daily before a meal.   Yes [provider]  lisinopril (PRINIVIL,ZESTRIL) 10 MG tablet Take 10 mg by mouth daily.   Yes [provider]  pantoprazole (PROTONIX) 40 MG tablet Take 40 mg by mouth daily. 11/28/19  Yes [provider]  ondansetron (ZOFRAN ODT) 4 MG disintegrating tablet Take 1 tablet (4 mg total) by mouth every 8 (eight) hours as needed for nausea or vomiting. 12/18/19   Sharman Cheek, MD     Allergies Penicillins   No family history on file.  Social History Social History   Tobacco Use  . Smoking status: Never Smoker  . Smokeless tobacco: Never Used  Substance Use Topics  . Alcohol use: Not on file  . Drug use: Not on file    Review of Systems  Constitutional:   No fever or chills.  ENT:   No sore throat. No rhinorrhea. Cardiovascular:   No chest pain or syncope. Respiratory:   No dyspnea or cough. Gastrointestinal:   Positive as above for abdominal pain without vomiting and diarrhea.  Musculoskeletal:   Negative for focal pain or swelling All other systems reviewed and are negative except as documented above in ROS and HPI.  ____________________________________________   PHYSICAL EXAM:  VITAL SIGNS: ED Triage Vitals [12/18/19 1644]  Enc Vitals  Group     BP (!) 146/92     Pulse Rate (!) 112     Resp 16     Temp 98.8 F (37.1 C)     Temp Source Oral     SpO2 98 %     Weight 143 lb (64.9 kg)     Height 5\' 6"  (1.676 m)     Head Circumference      Peak Flow      Pain Score 6     Pain Loc      Pain Edu?      Excl. in GC?     Vital signs reviewed, nursing assessments reviewed.   Constitutional:   Alert and oriented. Non-toxic  appearance. Eyes:   Conjunctivae are normal. EOMI. PERRL. ENT      Head:   Normocephalic and atraumatic.      Nose:   Wearing a mask.      Mouth/Throat:   Wearing a mask.      Neck:   No meningismus. Full ROM. Hematological/Lymphatic/Immunilogical:   No cervical lymphadenopathy. Cardiovascular:   RRR. Symmetric bilateral radial and DP pulses.  No murmurs. Cap refill less than 2 seconds. Respiratory:   Normal respiratory effort without tachypnea/retractions. Breath sounds are clear and equal bilaterally. No wheezes/rales/rhonchi. Gastrointestinal:   Soft and nontender. Non distended. There is no CVA tenderness.  No rebound, rigidity, or guarding. Musculoskeletal:   Normal range of motion in all extremities. No joint effusions.  No lower extremity tenderness.  No edema. Neurologic:   Normal speech and language.  Motor grossly intact. No acute focal neurologic deficits are appreciated.  Skin:    Skin is warm, dry and intact. No rash noted.  No petechiae, purpura, or bullae.  ____________________________________________    LABS (pertinent positives/negatives) (all labs ordered are listed, but only abnormal results are displayed) Labs Reviewed  COMPREHENSIVE METABOLIC PANEL - Abnormal; Notable for the following components:      Result Value   Glucose, Bld 149 (*)    All other components within normal limits  CBC - Abnormal; Notable for the following components:   WBC 11.3 (*)    Platelets 452 (*)    All other components within normal limits  URINALYSIS, COMPLETE (UACMP) WITH MICROSCOPIC - Abnormal; Notable for the following components:   Color, Urine YELLOW (*)    APPearance CLEAR (*)    All other components within normal limits  LIPASE, BLOOD   ____________________________________________   EKG  Interpreted by me Sinus tachycardia rate 108.  Normal axis and intervals.  Poor R wave progression.  Normal ST segments.  T wave inversions in inferior leads.  Unchanged compared to  previous EKG September 14, 2018  ____________________________________________    RADIOLOGY  CT ABDOMEN PELVIS W CONTRAST  Result Date: 12/18/2019 CLINICAL DATA:  Abdominal distension. Abdominal pain. Possible bowel obstruction on radiograph earlier this day. EXAM: CT ABDOMEN AND PELVIS WITH CONTRAST TECHNIQUE: Multidetector CT imaging of the abdomen and pelvis was performed using the standard protocol following bolus administration of intravenous contrast. CONTRAST:  12/20/2019 OMNIPAQUE IOHEXOL 300 MG/ML  SOLN COMPARISON:  Report from abdominal radiograph earlier this day, images unavailable FINDINGS: Lower chest: Lung bases are clear. Hepatobiliary: No focal liver abnormality is seen. No gallstones, gallbladder wall thickening, or biliary dilatation. Pancreas: No ductal dilatation or inflammation. Spleen: Normal in size without focal abnormality. Adrenals/Urinary Tract: Normal adrenal glands. No hydronephrosis or perinephric edema. Homogeneous renal enhancement with symmetric excretion on delayed phase imaging. 8  mm low-density lesion in the upper right kidney is too small to characterize but likely cyst. Urinary bladder is near completely empty, wall thickening likely related to nondistention. Stomach/Bowel: Bowel evaluation is limited in the absence of enteric contrast. Ingested material within the stomach. There is no gastric wall thickening. Proximal small bowel loops are nondistended. Distal small bowel loops are mildly dilated, fluid-filled with mild wall hyperemia and mesenteric edema. No transition point. Cecum is fluid-filled with similar mild hyperemia. The appendix is normal. Remainder of the colon is decompressed. Minimal colonic diverticulosis without diverticulitis. Vascular/Lymphatic: Moderate aortic atherosclerosis. Plaque causes 50% luminal narrowing of the distal aorta just proximal to the iliac bifurcation. No aortic aneurysm. Mesenteric branch vessels are patent. Portal vein is patent. Few  prominent central mesenteric nodes are likely reactive. Reproductive: Prominent prostate gland. Other: Mild mesenteric edema and free fluid in the area of small bowel inflammation with free fluid tracking into the pelvis. No free air or abscess. No abdominal hernia. Musculoskeletal: There are no acute or suspicious osseous abnormalities. IMPRESSION: 1. Fluid-filled mildly dilated distal small bowel with mild wall hyperemia and mesenteric edema. No transition point. Findings most consistent enteritis, may be infectious or inflammatory. Small amount of free fluid tracks into the pelvis. No perforation or abscess. 2. Minimal colonic diverticulosis without diverticulitis. 3. Aortic atherosclerosis. Plaque in the distal aorta causes 50% luminal narrowing just proximal to the iliac bifurcation. Mesenteric vessels are widely patent. Aortic Atherosclerosis (ICD10-I70.0). Electronically Signed   By: Keith Rake M.D.   On: 12/18/2019 18:06    ____________________________________________   PROCEDURES Procedures  ____________________________________________  DIFFERENTIAL DIAGNOSIS   Bowel obstruction, ileus, intra-abdominal abscess, GERD, enteritis, Crohn's  CLINICAL IMPRESSION / ASSESSMENT AND PLAN / ED COURSE  Medications ordered in the ED: Medications  iohexol (OMNIPAQUE) 300 MG/ML solution 100 mL (100 mLs Intravenous Contrast Given 12/18/19 1729)  metoCLOPramide (REGLAN) injection 10 mg (10 mg Intravenous Given 12/18/19 1937)  famotidine (PEPCID) IVPB 20 mg premix (20 mg Intravenous New Bag/Given 12/18/19 1937)  alum & mag hydroxide-simeth (MAALOX/MYLANTA) 200-200-20 MG/5ML suspension 30 mL (30 mLs Oral Given 12/18/19 1937)    Pertinent labs & imaging results that were available during my care of the patient were reviewed by me and considered in my medical decision making (see chart for details).  Ryan Bonilla was evaluated in Emergency Department on 12/18/2019 for the symptoms described in the  history of present illness. He was evaluated in the context of the global COVID-19 pandemic, which necessitated consideration that the patient might be at risk for infection with the SARS-CoV-2 virus that causes COVID-19. Institutional protocols and algorithms that pertain to the evaluation of patients at risk for COVID-19 are in a state of rapid change based on information released by regulatory bodies including the CDC and federal and state organizations. These policies and algorithms were followed during the patient's care in the ED.   Patient presents with recurrent episode of abdominal pain, loss of appetite.  Labs unremarkable.  CT scan shows a small segment of enteritis at the distal small intestine.  No evidence of abscess perforation or obstruction.  Not consistent with ileus.  No pneumatosis.  Patient feels better and is tolerating oral intake.  Stable for discharge home.  Recommend follow-up with gastroenterology for further assessment of symptoms, consideration of IBD given the location of the finding and recurrent symptoms..      ____________________________________________   FINAL CLINICAL IMPRESSION(S) / ED DIAGNOSES    Final diagnoses:  Enteritis  Abdominal pain   ED Discharge Orders         Ordered    ondansetron (ZOFRAN ODT) 4 MG disintegrating tablet  Every 8 hours PRN     12/18/19 2038          Portions of this note were generated with dragon dictation software. Dictation errors may occur despite best attempts at proofreading.   Sharman Cheek, MD 12/18/19 2042

## 2019-12-18 NOTE — ED Triage Notes (Signed)
Reports recurrent abdominal pain, has been followed by GI. Had "stomach xray" today and referred to ER for CT due to possible bowel obstruction. Acute pain started this AM that "comes in waves". Unable to recall last BM. Bloating and pressure present in abdominal. Pt alert and oriented X4, cooperative, RR even and unlabored, color WNL. Pt in NAD.

## 2019-12-18 NOTE — ED Notes (Signed)
Pt reports upper abd pain.  Sx for 2 months. Sx worse for past 3-4 days.   Pt sent to er for eval of bowel obstruction.  Pt reports diarrhea.  No vomiting.  Iv in place.  Pt alert  Speech clear.  Pt in hallway bed.

## 2019-12-18 NOTE — ED Notes (Signed)
Report off to chrissy rn  

## 2019-12-18 NOTE — Discharge Instructions (Signed)
Your CT scan shows inflammation of the end of the small intestine where it reaches your large intestine. You should follow a bland diet and make an appointment with gastroenterology for further evaluation of your symptoms.  There is no sign of a bowel obstruction.

## 2020-01-25 ENCOUNTER — Other Ambulatory Visit: Payer: Self-pay | Admitting: Gastroenterology

## 2020-01-25 DIAGNOSIS — R1033 Periumbilical pain: Secondary | ICD-10-CM

## 2020-02-12 ENCOUNTER — Encounter
Admission: RE | Admit: 2020-02-12 | Discharge: 2020-02-12 | Disposition: A | Discharge status: Home / Self Care | Payer: Managed Care, Other (non HMO) | Source: Ambulatory Visit | Attending: Gastroenterology | Admitting: Gastroenterology

## 2020-02-12 ENCOUNTER — Other Ambulatory Visit: Payer: Self-pay

## 2020-02-12 DIAGNOSIS — R1033 Periumbilical pain: Secondary | ICD-10-CM | POA: Diagnosis present

## 2020-02-12 MED ORDER — TECHNETIUM TC 99M MEBROFENIN IV KIT
5.0000 | PACK | Freq: Once | INTRAVENOUS | Status: AC | PRN
  Administered 2020-02-12: 5.49 via INTRAVENOUS

## 2020-06-12 ENCOUNTER — Other Ambulatory Visit: Payer: Self-pay

## 2020-06-12 ENCOUNTER — Encounter: Payer: Self-pay | Admitting: Nurse Practitioner

## 2020-06-12 ENCOUNTER — Ambulatory Visit: Payer: Managed Care, Other (non HMO) | Admitting: Nurse Practitioner

## 2020-06-12 VITALS — BP 115/66 | HR 72 | Temp 98.1°F | Resp 16 | Ht 67.0 in | Wt 146.0 lb

## 2020-06-12 DIAGNOSIS — Z008 Encounter for other general examination: Secondary | ICD-10-CM | POA: Diagnosis not present

## 2020-06-12 DIAGNOSIS — Z0000 Encounter for general adult medical examination without abnormal findings: Secondary | ICD-10-CM | POA: Diagnosis not present

## 2020-06-12 NOTE — Patient Instructions (Signed)
Health Maintenance, Male Adopting a healthy lifestyle and getting preventive care are important in promoting health and wellness. Ask your health care provider about:  The right schedule for you to have regular tests and exams.  Things you can do on your own to prevent diseases and keep yourself healthy. What should I know about diet, weight, and exercise? Eat a healthy diet   Eat a diet that includes plenty of vegetables, fruits, low-fat dairy products, and lean protein.  Do not eat a lot of foods that are high in solid fats, added sugars, or sodium. Maintain a healthy weight Body mass index (BMI) is a measurement that can be used to identify possible weight problems. It estimates body fat based on height and weight. Your health care provider can help determine your BMI and help you achieve or maintain a healthy weight. Get regular exercise Get regular exercise. This is one of the most important things you can do for your health. Most adults should:  Exercise for at least 150 minutes each week. The exercise should increase your heart rate and make you sweat (moderate-intensity exercise).  Do strengthening exercises at least twice a week. This is in addition to the moderate-intensity exercise.  Spend less time sitting. Even light physical activity can be beneficial. Watch cholesterol and blood lipids Have your blood tested for lipids and cholesterol at 62 years of age, then have this test every 5 years. You may need to have your cholesterol levels checked more often if:  Your lipid or cholesterol levels are high.  You are older than 62 years of age.  You are at high risk for heart disease. What should I know about cancer screening? Many types of cancers can be detected early and may often be prevented. Depending on your health history and family history, you may need to have cancer screening at various ages. This may include screening for:  Colorectal cancer.  Prostate  cancer.  Skin cancer.  Lung cancer. What should I know about heart disease, diabetes, and high blood pressure? Blood pressure and heart disease  High blood pressure causes heart disease and increases the risk of stroke. This is more likely to develop in people who have high blood pressure readings, are of African descent, or are overweight.  Talk with your health care provider about your target blood pressure readings.  Have your blood pressure checked: ? Every 3-5 years if you are 31-8 years of age. ? Every year if you are 1 years old or older.  If you are between the ages of 51 and 65 and are a current or former smoker, ask your health care provider if you should have a one-time screening for abdominal aortic aneurysm (AAA). Diabetes Have regular diabetes screenings. This checks your fasting blood sugar level. Have the screening done:  Once every three years after age 52 if you are at a normal weight and have a low risk for diabetes.  More often and at a younger age if you are overweight or have a high risk for diabetes. What should I know about preventing infection? Hepatitis B If you have a higher risk for hepatitis B, you should be screened for this virus. Talk with your health care provider to find out if you are at risk for hepatitis B infection. Hepatitis C Blood testing is recommended for:  Everyone born from [birth year] through 1963.  Anyone with known risk factors for hepatitis C. Sexually transmitted infections (STIs)  You should be screened each year  for STIs, including gonorrhea and chlamydia, if: ? You are sexually active and are younger than 62 years of age. ? You are older than 62 years of age and your health care provider tells you that you are at risk for this type of infection. ? Your sexual activity has changed since you were last screened, and you are at increased risk for chlamydia or gonorrhea. Ask your health care provider if you are at risk.  Ask your  health care provider about whether you are at high risk for HIV. Your health care provider may recommend a prescription medicine to help prevent HIV infection. If you choose to take medicine to prevent HIV, you should first get tested for HIV. You should then be tested every 3 months for as long as you are taking the medicine. Follow these instructions at home: Lifestyle  Do not use any products that contain nicotine or tobacco, such as cigarettes, e-cigarettes, and chewing tobacco. If you need help quitting, ask your health care provider.  Do not use street drugs.  Do not share needles.  Ask your health care provider for help if you need support or information about quitting drugs. Alcohol use  Do not drink alcohol if your health care provider tells you not to drink.  If you drink alcohol: ? Limit how much you have to 0-2 drinks a day. ? Be aware of how much alcohol is in your drink. In the [place] one drink equals one 12 oz bottle of beer (355 mL), one 5 oz glass of wine (148 mL), or one 1 oz glass of hard liquor (44 mL). General instructions  Schedule regular health, dental, and eye exams.  Stay current with your vaccines.  Tell your health care provider if: ? You often feel depressed. ? You have ever been abused or do not feel safe at home. Summary  Adopting a healthy lifestyle and getting preventive care are important in promoting health and wellness.  Follow your health care provider's instructions about healthy diet, exercising, and getting tested or screened for diseases.  Follow your health care provider's instructions on monitoring your cholesterol and blood pressure. This information is not intended to replace advice given to you by your health care provider. Make sure you discuss any questions you have with your health care provider. Document Revised: 10/05/2018 Document Reviewed: 10/05/2018 Elsevier Patient Education  2020 [employer].  Immunization Schedule, 57-24  Years Old  Vaccines are usually given at various ages, according to a schedule. Your health care provider will recommend vaccines for you based on your age, medical history, and lifestyle or other factors, such as travel or where you work. You may receive vaccines as individual doses or as more than one vaccine together in one shot (combination vaccines). Talk with your health care provider about the risks and benefits of combination vaccines. Recommended immunizations for 74-44 years old Influenza vaccine  You should get a dose of the influenza vaccine every year. Tetanus, diphtheria, and pertussis vaccine A vaccine that protects against tetanus, diphtheria, and pertussis is known as the Tdap vaccine. A vaccine that protects against tetanus and diphtheria is known as the Td vaccine.  You should only get the Td vaccine if you have had at least 1 dose of the Tdap vaccine.  You should get 1 dose of the Td or Tdap vaccine every 10 years, or you should get 1 dose of the Tdap vaccine if: ? You have not previously gotten a Tdap vaccine. ? You  do not know if you have ever gotten a Tdap vaccine. ? You are between 27 and [redacted] weeks pregnant. Measles, mumps, and rubella vaccine This is also known as the MMR vaccine. You may need to get the MMR vaccine if:  You need to catch up on doses you missed in the past.  You have not been given the vaccine before.  You do not have evidence of immunity (by a blood test). Pregnant women should not get the MMR vaccine during pregnancy because it may be harmful to the unborn baby. However, if you are not immune to measles, mumps, or rubella, you should get a dose of MMR vaccine one month or more before pregnancy or within days after delivery. Varicella vaccine This is also known as the VAR vaccine. You may need to get the VAR vaccine if you were born in 1980 or later and:  You need to catch up on doses you missed in the past.  You have not been given the vaccine  before.  You do not have evidence of immunity (by a blood test).  You have certain high-risk conditions, such as HIV or AIDS. Pregnant women should not get the VAR vaccine during pregnancy because it may be harmful to the unborn baby. However, if you are not immune to chickenpox (varicella), you should get a dose of the VAR vaccine within days after delivery. Human papillomavirus vaccine This is also known as the HPV vaccine. If you have not gotten the vaccine before or you missed doses in the past, talk to your health care provider about whether it is appropriate for you to get the HPV vaccine. Pneumococcal conjugate vaccine This is also known as the PCV13 vaccine. You should get the PCV13 vaccine as recommended if you have certain high-risk conditions. These include:  Diabetes.  Chronic conditions of the heart, lungs, or liver.  Conditions that affect the body's disease-fighting system (immune system). Pneumococcal polysaccharide vaccine This is also known as the PPSV23 vaccine. You should get the PPSV23 vaccine as recommended if you have certain high-risk conditions. These include:  Diabetes.  Chronic conditions of the heart, lungs, or liver.  Conditions that affect the immune system. Hepatitis A vaccine This is also known as the HepA vaccine. If you did not get the HepA vaccine previously, you should get it if:  You are at risk for a hepatitis A infection. You may be at risk for infection if you: ? Have chronic liver disease. ? Have HIV or AIDS. ? Are a man who has sex with men. ? Use drugs. ? Are homeless. ? May be exposed to hepatitis A through work. ? Travel to countries where hepatitis A is common. ? Are pregnant. ? Have or will have close contact with someone who was adopted from another country.  You are not at risk for infection but want protection from hepatitis A. Hepatitis B vaccine This is also known as the HepB vaccine. If you did not get the HepB vaccine  previously, you should get it if:  You are at risk for hepatitis B infection. You are at risk if you: ? Have chronic liver disease. ? Have HIV or AIDS. ? Have sex with a partner who has hepatitis B, or:  You have multiple sex partners.  You are a man who has sex with men. ? Use drugs. ? May be exposed to hepatitis B through work. ? Live with someone who has hepatitis B. ? Receive dialysis treatment. ? Have diabetes. ?  Travel to countries where hepatitis B is common. ? Are pregnant.  You are not at risk of infection but want protection from hepatitis B. Meningococcal conjugate vaccine This is also known as the MenACWY vaccine. You may need to get the MenACWY vaccine if you:  Have not been given the vaccine before.  Need to catch up on doses you missed in the past. This vaccine is especially important if you:  Do not have a spleen.  Have sickle cell disease.  Have HIV.  Take medicines that suppress your immune system.  Travel to countries where meningococcal disease is common.  Are exposed to Neisseria meningitidis at work. Serogroup B meningococcal vaccine This is also known as the MenB vaccine. You may need to get the MenB vaccine if you:  Have not been given the vaccine before.  Need to catch up on doses you missed in the past. This vaccine is especially important if you:  Do not have a spleen.  Have sickle cell disease.  Take medicines that suppress your immune system.  Are exposed to Neisseria meningitidis at work. Haemophilus influenzae type b vaccine This is also known as the Hib vaccine. Anyone older than 62 years of age is usually not given the Hib vaccine. However, if you have certain high-risk conditions, you may need to get this vaccine. These conditions include:  Not having a spleen.  Having received a stem cell transplant. Before you get a vaccine: Talk with your health care provider about which vaccines are right for you. This is especially  important if:  You previously had a reaction after getting a vaccine.  You have a weakened immune system. You may have a weakened immune system if you: ? Are taking medicines that reduce (suppress) the activity of your immune system. ? Are taking medicines to treat cancer (chemotherapy). ? Have HIV or AIDS.  You work in an environment where you may be exposed to a disease.  You plan to travel outside of the country.  You have a chronic illness, such as heart disease, kidney disease, diabetes, or lung disease.  You are pregnant, think you may be pregnant, or are planning to become pregnant. Summary  Before you get a vaccine, tell your health care provider if you have reacted to vaccines in the past or have a condition that weakens your immune system.  At 27-49 years, you should get a dose of the influenza vaccine every year and a dose of the Td or Tdap vaccine every 10 years.  Depending on your medical history and your risk factors, you may need other vaccines. Ask your health care provider whether you are up to date on all your vaccines.  Women who are pregnant may not receive certain vaccines. Ask your health care provider whether you should receive any vaccines soon after you deliver your baby. This information is not intended to replace advice given to you by your health care provider. Make sure you discuss any questions you have with your health care provider. Document Revised: 08/09/2019 Document Reviewed: 08/09/2019 Elsevier Patient Education  [place].  Immunization Schedule, 16-9 Years Old  Vaccines are usually given at various ages, according to a schedule. Your health care provider will recommend vaccines for you based on your age, medical history, and lifestyle or other factors such as travel or where you work. You may receive vaccines as individual doses or as more than one vaccine together in one shot (combination vaccines). Talk with your health care provider  about the risks and benefits of combination vaccines. Recommended immunizations for 61-62 years old Influenza vaccine  You should get a dose of the influenza vaccine every year. Tetanus, diphtheria, and pertussis vaccine A vaccine that protects against tetanus, diphtheria, and pertussis is known as the Tdap vaccine. A vaccine that protects against tetanus and diphtheria is known as the Td vaccine.  You should only get the Td vaccine if you have had at least 1 dose of the Tdap vaccine.  You should get 1 dose of the Td or Tdap vaccine every 10 years, or you should get 1 dose of the Tdap vaccine if: ? You have not previously gotten a Tdap vaccine. ? You do not know if you have ever gotten a Tdap vaccine. Zoster vaccine This is also known as the RZV vaccine. You should get 2 doses of the RZV vaccine 2 to 6 months apart. It is important to get the RZV vaccine even if you:  Have had shingles.  Have received the ZVL vaccine, an older version of the RZV vaccine.  Are unsure if you have had chickenpox (varicella). Pneumococcal conjugate vaccine This is also known as the PCV13 vaccine. You should get the PCV13 vaccine as recommended if you have certain high-risk conditions. These include:  Diabetes.  Chronic conditions of the heart, lungs, or liver.  Conditions that affect the body's disease-fighting system (immune system). Pneumococcal polysaccharide vaccine This is also known as the PPSV23 vaccine. You should get the PPSV23 vaccine as recommended if you have certain high-risk conditions. These include:  Diabetes.  Chronic conditions of the heart, lungs, or liver.  Conditions that affect the immune system. Hepatitis A vaccine This is also known as the HepA vaccine. If you did not get the HepA vaccine previously, you should get it if:  You are at risk for a hepatitis A infection. You may be at risk for infection if you: ? Have chronic liver disease. ? Have HIV or AIDS. ? Are a man  who has sex with men. ? Use drugs. ? Are homeless. ? May be exposed to hepatitis A through work. ? Travel to countries where hepatitis A is common. ? Have or will have close contact with someone who was adopted from another country.  You are not at risk for infection but want protection from hepatitis A. Hepatitis B vaccine This is also known as the HepB vaccine. If you did not get the HepB vaccine previously, you should get it if:  You are at risk for hepatitis B infection. You are at risk if you: ? Have chronic liver disease. ? Have HIV or AIDS. ? Have sex with a partner who has hepatitis B, or:  You have multiple sex partners.  You are a man who has sex with men. ? Use drugs. ? May be exposed to hepatitis B through work. ? Live with someone who has hepatitis B. ? Receive dialysis treatment. ? Have diabetes. ? Travel to countries where hepatitis B is common.  You are not at risk of infection but want protection from hepatitis B. Measles, mumps, and rubella vaccine This is also known as the MMR vaccine. You may need to get the MMR vaccine if you were born in 1957 or later and:  You need to catch up on doses you missed in the past.  You have not been given the vaccine before.  You do not have evidence of immunity (by a blood test). Varicella vaccine This is also known as the  VAR vaccine. You may need to get the VAR vaccine if you do not have evidence of immunity (by a blood test) and you may be exposed to varicella through work. This is especially important if you work in a health care setting. Meningococcal conjugate vaccine This is also known as the MenACWY vaccine. You may need to get the MenACWY vaccine if you:  Have not been given the vaccine before.  Need to catch up on doses you missed in the past. This vaccine is especially important if you:  Do not have a spleen.  Have sickle cell disease.  Have HIV.  Take medicines that suppress your immune  system.  Travel to countries where meningococcal disease is common.  Are exposed to Neisseria meningitidis at work. Serogroup B meningococcal vaccine This is also known as the MenB vaccine. You may need to get the MenB vaccine if you:  Have not been given the vaccine before.  Need to catch up on doses you missed in the past. This vaccine is especially important if you:  Do not have a spleen.  Have sickle cell disease.  Take medicines that suppress your immune system.  Are exposed to Neisseria meningitidis at work. Haemophilus influenzae type b vaccine This is also known as the Hib vaccine. Anyone older than 62 years of age is usually not given the Hib vaccine. However, if you have certain high-risk conditions, you may need to get this vaccine. These conditions include:  Not having a spleen.  Having received a stem cell transplant. Before you get a vaccine: Talk with your health care provider about which vaccines are right for you. This is especially important if:  You previously had a reaction after getting a vaccine.  You have a weakened immune system. You may have a weakened immune system if you: ? Are taking medicines that reduce (suppress) the activity of your immune system. ? Are taking medicines to treat cancer (chemotherapy). ? Have HIV or AIDS.  You work in an environment where you may be exposed to a disease.  You plan to travel outside of the country.  You have a chronic illness, such as heart disease, kidney disease, diabetes, or lung disease. Summary  Before you get a vaccine, tell your health care provider if you have reacted to vaccines in the past or have a condition that weakens your immune system.  At 50-64 years, you should get a dose of the flu vaccine every year and a dose of the Td or Tdap vaccine every 10 years.  You should get 2 doses of the RZV vaccine 2 to 6 months apart.  Depending on your medical history and your risk factors, you may need  other vaccines. Ask your health care provider whether you are up to date on all your vaccines. This information is not intended to replace advice given to you by your health care provider. Make sure you discuss any questions you have with your health care provider. Document Revised: 08/08/2019 Document Reviewed: 08/08/2019 Elsevier Patient Education  2020 [employer]  Preventive Care 57-19 Years Old, Male Preventive care refers to lifestyle choices and visits with your health care provider that can promote health and wellness. This includes:  A yearly physical exam. This is also called an annual well check.  Regular dental and eye exams.  Immunizations.  Screening for certain conditions.  Healthy lifestyle choices, such as eating a healthy diet, getting regular exercise, not using drugs or products that contain nicotine and tobacco, and  limiting alcohol use. What can I expect for my preventive care visit? Physical exam Your health care provider will check:  Height and weight. These may be used to calculate body mass index (BMI), which is a measurement that tells if you are at a healthy weight.  Heart rate and blood pressure.  Your skin for abnormal spots. Counseling Your health care provider may ask you questions about:  Alcohol, tobacco, and drug use.  Emotional well-being.  Home and relationship well-being.  Sexual activity.  Eating habits.  Work and work Statistician. What immunizations do I need?  Influenza (flu) vaccine  This is recommended every year. Tetanus, diphtheria, and pertussis (Tdap) vaccine  You may need a Td booster every 10 years. Varicella (chickenpox) vaccine  You may need this vaccine if you have not already been vaccinated. Zoster (shingles) vaccine  You may need this after age 67. Measles, mumps, and rubella (MMR) vaccine  You may need at least one dose of MMR if you were born in 1957 or later. You may also need a second  dose. Pneumococcal conjugate (PCV13) vaccine  You may need this if you have certain conditions and were not previously vaccinated. Pneumococcal polysaccharide (PPSV23) vaccine  You may need one or two doses if you smoke cigarettes or if you have certain conditions. Meningococcal conjugate (MenACWY) vaccine  You may need this if you have certain conditions. Hepatitis A vaccine  You may need this if you have certain conditions or if you travel or work in places where you may be exposed to hepatitis A. Hepatitis B vaccine  You may need this if you have certain conditions or if you travel or work in places where you may be exposed to hepatitis B. Haemophilus influenzae type b (Hib) vaccine  You may need this if you have certain risk factors. Human papillomavirus (HPV) vaccine  If recommended by your health care provider, you may need three doses over 6 months. You may receive vaccines as individual doses or as more than one vaccine together in one shot (combination vaccines). Talk with your health care provider about the risks and benefits of combination vaccines. What tests do I need? Blood tests  Lipid and cholesterol levels. These may be checked every 5 years, or more frequently if you are over 52 years old.  Hepatitis C test.  Hepatitis B test. Screening  Lung cancer screening. You may have this screening every year starting at age 20 if you have a 30-pack-year history of smoking and currently smoke or have quit within the past 15 years.  Prostate cancer screening. Recommendations will vary depending on your family history and other risks.  Colorectal cancer screening. All adults should have this screening starting at age 46 and continuing until age 57. Your health care provider may recommend screening at age 28 if you are at increased risk. You will have tests every 1-10 years, depending on your results and the type of screening test.  Diabetes screening. This is done by  checking your blood sugar (glucose) after you have not eaten for a while (fasting). You may have this done every 1-3 years.  Sexually transmitted disease (STD) testing. Follow these instructions at home: Eating and drinking  Eat a diet that includes fresh fruits and vegetables, whole grains, lean protein, and low-fat dairy products.  Take vitamin and mineral supplements as recommended by your health care provider.  Do not drink alcohol if your health care provider tells you not to drink.  If you drink  alcohol: ? Limit how much you have to 0-2 drinks a day. ? Be aware of how much alcohol is in your drink. In the [place] one drink equals one 12 oz bottle of beer (355 mL), one 5 oz glass of wine (148 mL), or one 1 oz glass of hard liquor (44 mL). Lifestyle  Take daily care of your teeth and gums.  Stay active. Exercise for at least 30 minutes on 5 or more days each week.  Do not use any products that contain nicotine or tobacco, such as cigarettes, e-cigarettes, and chewing tobacco. If you need help quitting, ask your health care provider.  If you are sexually active, practice safe sex. Use a condom or other form of protection to prevent STIs (sexually transmitted infections).  Talk with your health care provider about taking a low-dose aspirin every day starting at age 19. What's next?  Go to your health care provider once a year for a well check visit.  Ask your health care provider how often you should have your eyes and teeth checked.  Stay up to date on all vaccines. This information is not intended to replace advice given to you by your health care provider. Make sure you discuss any questions you have with your health care provider. Document Revised: 10/06/2018 Document Reviewed: 10/06/2018 Elsevier Patient Education  2020 [employer].

## 2020-06-12 NOTE — Progress Notes (Signed)
  Subjective:     Patient ID: Ryan Bonilla, male   DOB: 18-Nov-1955, 64 y.o.   MRN: [id number]  HPI Ryan Bonilla is a 64 y.o. male who presents to the [facility] for his annual biometric screening exam. He is employed in the [employer] department. He has been working there for the past 16 years. He denies any problems today.   PMH: Esophageal stricture, Hyperlipidemia, HTN, Kidney stones Surgery: EGD, Colonoscopy, SH: Denies use of tobacco, ETOH or drugs  Meds: Lipitor, Flonase, Lopid  Allergies: Penicillin  Immunizations: has not had Covid Vaccine, did have influenza vaccine last year.  Review of Systems  Employee denies any problems today.     Objective: BP 115/66 (BP Location: Right Arm, Patient Position: Sitting, Cuff Size: Normal)   Pulse 72   Temp 98.1 F (36.7 C) (Temporal)   Resp 16   Ht 5\' 7"  (1.702 m)   Wt 146 lb (66.2 kg)   SpO2 98%   BMI 22.87 kg/m     Physical Exam Vitals and nursing note reviewed.  Constitutional:      Appearance: Normal appearance.  HENT:     Head: Normocephalic and atraumatic.     Right Ear: Tympanic membrane normal.     Left Ear: Tympanic membrane normal.     Nose: Nose normal.     Mouth/Throat:     Mouth: Mucous membranes are moist.     Pharynx: Oropharynx is clear.  Eyes:     Extraocular Movements: Extraocular movements intact.  Neck:     Thyroid: No thyroid mass.     Vascular: Normal carotid pulses. No carotid bruit or JVD.  Cardiovascular:     Rate and Rhythm: Normal rate and regular rhythm.  Pulmonary:     Effort: Pulmonary effort is normal.     Breath sounds: Normal breath sounds.  Abdominal:     Tenderness: There is no abdominal tenderness.  Musculoskeletal:        General: Normal range of motion.     Cervical back: Normal range of motion.  Skin:    General: Skin is warm and dry.  Neurological:     General: No focal deficit present.     Mental Status: He is alert.     Motor: No weakness.     Coordination: Romberg  sign negative.     Gait: Gait normal.     Deep Tendon Reflexes: Reflexes normal.  Psychiatric:        Mood and Affect: Mood normal.        Speech: Speech normal.        Behavior: Behavior normal.        Thought Content: Thought content normal.     Comments: Ambulatory with steady gait, stands on one foot without difficulty. Grips are equal, radial pulses 2+.       Assessment:    1. Encounter for other general examination  2. Encounter for biometric screening - Glucose, random - Lipid panel  3. Encounter for preventive health examination   Limited physical exam today is normal.    Plan:    Discussed with the employee immunizations and healthy living. Instructions given in writing. Employee given opportunity to ask questions. All questions answered and employee voices understanding. He will f/u with his PCP and discuss Covid vaccine.

## 2020-06-13 LAB — LIPID PANEL
Chol/HDL Ratio: 4.5 ratio (ref 0.0–5.0)
Cholesterol, Total: 179 mg/dL (ref 100–199)
HDL: 40 mg/dL (ref >39)
LDL Chol Calc (NIH): 118 mg/dL — ABNORMAL HIGH (ref 0–99)
Triglycerides: 113 mg/dL (ref 0–149)
VLDL Cholesterol Cal: 21 mg/dL (ref 5–40)

## 2020-06-13 LAB — GLUCOSE, RANDOM: Glucose: 79 mg/dL (ref 65–99)

## 2020-07-25 ENCOUNTER — Other Ambulatory Visit: Payer: Self-pay | Admitting: Gastroenterology

## 2020-07-25 ENCOUNTER — Other Ambulatory Visit (HOSPITAL_COMMUNITY): Payer: Self-pay | Admitting: Gastroenterology

## 2020-07-25 DIAGNOSIS — R1314 Dysphagia, pharyngoesophageal phase: Secondary | ICD-10-CM

## 2020-07-26 ENCOUNTER — Ambulatory Visit: Payer: Managed Care, Other (non HMO)

## 2020-07-26 ENCOUNTER — Other Ambulatory Visit (HOSPITAL_COMMUNITY): Payer: Self-pay | Admitting: Gastroenterology

## 2020-07-26 DIAGNOSIS — Z299 Encounter for prophylactic measures, unspecified: Secondary | ICD-10-CM

## 2020-07-30 ENCOUNTER — Other Ambulatory Visit: Payer: Self-pay

## 2020-07-30 ENCOUNTER — Ambulatory Visit
Admission: RE | Admit: 2020-07-30 | Discharge: 2020-07-30 | Disposition: A | Discharge status: Home / Self Care | Payer: Managed Care, Other (non HMO) | Source: Ambulatory Visit | Attending: Gastroenterology | Admitting: Gastroenterology

## 2020-07-30 ENCOUNTER — Other Ambulatory Visit: Payer: Self-pay | Admitting: Gastroenterology

## 2020-07-30 DIAGNOSIS — R1314 Dysphagia, pharyngoesophageal phase: Secondary | ICD-10-CM | POA: Insufficient documentation

## 2020-09-14 DIAGNOSIS — D509 Iron deficiency anemia, unspecified: Secondary | ICD-10-CM | POA: Insufficient documentation

## 2020-09-14 NOTE — Progress Notes (Signed)
[facility]  Telephone:([phone] Fax:(336) [phone]  ID: Ryan Bonilla OB: 1956/10/22  MR#: [id number]  HCW#:[id number]  Patient Care Team: Gracelyn Nurse, MD as PCP - General (Internal Medicine)  CHIEF COMPLAINT: Iron deficiency anemia.  INTERVAL HISTORY: Patient is a 64 year old male who was noted to have a declining hemoglobin and iron stores over the past 8 to 9 months.  Subsequent EGD and colonoscopy were unrevealing.  He currently feels well and is asymptomatic.  He has no neurologic complaints.  He denies any recent fevers or illnesses.  He has a good appetite and denies weight loss.  He has no chest pain, shortness of breath, cough, or hemoptysis.  He denies any nausea, vomiting, constipation, or diarrhea.  He denies any melena or hematochezia.  He has no urinary complaints.  Patient feels at his baseline offers no specific complaints today.  REVIEW OF SYSTEMS:   Review of Systems  Constitutional: Negative.  Negative for fever, malaise/fatigue and weight loss.  Respiratory: Negative.  Negative for cough, hemoptysis and shortness of breath.   Cardiovascular: Negative.  Negative for chest pain and leg swelling.  Gastrointestinal: Negative.  Negative for abdominal pain, blood in stool and melena.  Genitourinary: Negative.  Negative for hematuria.  Musculoskeletal: Negative.  Negative for back pain.  Skin: Negative.  Negative for rash.  Neurological: Negative.  Negative for dizziness, focal weakness, weakness and headaches.  Psychiatric/Behavioral: Negative.  The patient is not nervous/anxious.     As per HPI. Otherwise, a complete review of systems is negative.  PAST MEDICAL HISTORY: Past Medical History:  Diagnosis Date  . Esophageal stricture   . Hyperlipidemia   . Hypertension   . Kidney stones     PAST SURGICAL HISTORY: Past Surgical History:  Procedure Laterality Date  . COLONOSCOPY    . COLONOSCOPY WITH PROPOFOL N/A 10/10/2015    Procedure: COLONOSCOPY WITH PROPOFOL;  Surgeon: Wallace Cullens, MD;  Location: [facility] ENDOSCOPY;  Service: Gastroenterology;  Laterality: N/A;  . ESOPHAGOGASTRODUODENOSCOPY    . ESOPHAGOGASTRODUODENOSCOPY (EGD) WITH PROPOFOL N/A 10/10/2015   Procedure: ESOPHAGOGASTRODUODENOSCOPY (EGD) WITH PROPOFOL;  Surgeon: Wallace Cullens, MD;  Location: [facility] ENDOSCOPY;  Service: Gastroenterology;  Laterality: N/A;    FAMILY HISTORY: Family History  Problem Relation Age of Onset  . Hypertension Father     ADVANCED DIRECTIVES (Y/N):  N  HEALTH MAINTENANCE: Social History   Tobacco Use  . Smoking status: Never Smoker  . Smokeless tobacco: Never Used  Vaping Use  . Vaping Use: Never assessed  Substance Use Topics  . Alcohol use: Not Currently    Alcohol/week: 0.0 standard drinks  . Drug use: Not Currently     Colonoscopy:  PAP:  Bone density:  Lipid panel:  Allergies  Allergen Reactions  . Penicillins     Current Outpatient Medications  Medication Sig Dispense Refill  . atorvastatin (LIPITOR) 10 MG tablet Take 10 mg by mouth daily.    . fluticasone (FLONASE) 50 MCG/ACT nasal spray USE 2 SPRAYS IN EACH  NOSTRIL DAILY (Patient taking differently: Place 2 sprays into both nostrils daily. ) 48 g 3  . gemfibrozil (LOPID) 600 MG tablet Take 600 mg by mouth 2 (two) times daily before a meal.    . lisinopril (PRINIVIL,ZESTRIL) 10 MG tablet Take 10 mg by mouth daily.    Marland Kitchen aspirin 81 MG tablet Take 81 mg by mouth daily. (Patient not taking: Reported on 06/12/2020)    . ondansetron (ZOFRAN ODT) 4 MG disintegrating tablet  Take 1 tablet (4 mg total) by mouth every 8 (eight) hours as needed for nausea or vomiting. (Patient not taking: Reported on 06/12/2020) 20 tablet 0   No current facility-administered medications for this visit.    OBJECTIVE: Vitals:   09/17/20 1112  BP: 130/67  Pulse: 76  Temp: 98.4 F (36.9 C)  SpO2: 98%     Body mass index is 22.82 kg/m.    ECOG FS:0 - Asymptomatic  General:  Well-developed, well-nourished, no acute distress. Eyes: Pink conjunctiva, anicteric sclera. HEENT: Normocephalic, moist mucous membranes. Lungs: No audible wheezing or coughing. Heart: Regular rate and rhythm. Abdomen: Soft, nontender, no obvious distention. Musculoskeletal: No edema, cyanosis, or clubbing. Neuro: Alert, answering all questions appropriately. Cranial nerves grossly intact. Skin: No rashes or petechiae noted. Psych: Normal affect. Lymphatics: No cervical, calvicular, axillary or inguinal LAD.   LAB RESULTS:  Lab Results  Component Value Date   NA 138 12/18/2019   K 3.9 12/18/2019   CL 104 12/18/2019   CO2 28 12/18/2019   GLUCOSE 79 06/12/2020   BUN 17 12/18/2019   CREATININE 0.93 12/18/2019   CALCIUM 9.5 12/18/2019   PROT 7.9 12/18/2019   ALBUMIN 4.3 12/18/2019   AST 18 12/18/2019   ALT 18 12/18/2019   ALKPHOS 73 12/18/2019   BILITOT 0.7 12/18/2019   GFRNONAA >60 12/18/2019   GFRAA >60 12/18/2019    Lab Results  Component Value Date   WBC 11.3 (H) 12/18/2019   NEUTROABS 4.9 10/20/2017   HGB 15.4 12/18/2019   HCT 44.5 12/18/2019   MCV 87.3 12/18/2019   PLT 452 (H) 12/18/2019     STUDIES: No results found.  ASSESSMENT: Iron deficiency anemia.  PLAN:    1. Iron deficiency anemia: Patient's hemoglobin trended down from 15.6 in February 2021 to his most recent level of 11.0 on September 02, 2020.  Iron stores are also significantly reduced.  Colonoscopy and EGD at the beginning of November were reported as normal with no significant pathology.  Patient was noted to have mild gastritis.  Return to clinic in 2 and 3 weeks to receive 510 mg IV Feraheme.  He has been instructed to continue his oral iron supplementation.  Patient will then return to clinic in 4 months with repeat laboratory work and further evaluation.  If his hemoglobin does not improve with IV iron supplementation, will consider full anemia work-up at that time. 2.  Thrombocytosis: Likely  secondary to iron deficiency.  IV iron as above.  I spent a total of 45 minutes reviewing chart data, face-to-face evaluation with the patient, counseling and coordination of care as detailed above.   Patient expressed understanding and was in agreement with this plan. He also understands that He can call clinic at any time with any questions, concerns, or complaints.     Jeralyn Ruths, MD   09/17/2020 1:03 PM

## 2020-09-17 ENCOUNTER — Inpatient Hospital Stay: Payer: Managed Care, Other (non HMO) | Attending: Oncology | Admitting: Oncology

## 2020-09-17 ENCOUNTER — Inpatient Hospital Stay: Payer: Managed Care, Other (non HMO)

## 2020-09-17 ENCOUNTER — Encounter: Payer: Self-pay | Admitting: Oncology

## 2020-09-17 ENCOUNTER — Other Ambulatory Visit: Payer: Self-pay

## 2020-09-17 DIAGNOSIS — I10 Essential (primary) hypertension: Secondary | ICD-10-CM | POA: Insufficient documentation

## 2020-09-17 DIAGNOSIS — D509 Iron deficiency anemia, unspecified: Secondary | ICD-10-CM | POA: Insufficient documentation

## 2020-09-17 DIAGNOSIS — Z87442 Personal history of urinary calculi: Secondary | ICD-10-CM | POA: Diagnosis not present

## 2020-09-17 DIAGNOSIS — Z8249 Family history of ischemic heart disease and other diseases of the circulatory system: Secondary | ICD-10-CM | POA: Insufficient documentation

## 2020-09-17 DIAGNOSIS — Z7982 Long term (current) use of aspirin: Secondary | ICD-10-CM | POA: Insufficient documentation

## 2020-09-17 DIAGNOSIS — D75839 Thrombocytosis, unspecified: Secondary | ICD-10-CM | POA: Insufficient documentation

## 2020-09-17 DIAGNOSIS — E785 Hyperlipidemia, unspecified: Secondary | ICD-10-CM | POA: Insufficient documentation

## 2020-09-17 DIAGNOSIS — Z79899 Other long term (current) drug therapy: Secondary | ICD-10-CM | POA: Diagnosis not present

## 2020-10-02 ENCOUNTER — Inpatient Hospital Stay: Payer: Managed Care, Other (non HMO) | Attending: Oncology

## 2020-10-02 VITALS — BP 130/67 | HR 70 | Temp 96.0°F | Resp 18

## 2020-10-02 DIAGNOSIS — D509 Iron deficiency anemia, unspecified: Secondary | ICD-10-CM | POA: Diagnosis present

## 2020-10-02 MED ORDER — SODIUM CHLORIDE 0.9 % IV SOLN
510.0000 mg | Freq: Once | INTRAVENOUS | Status: AC
  Administered 2020-10-02: 510 mg via INTRAVENOUS
  Filled 2020-10-02: qty 510

## 2020-10-02 MED ORDER — SODIUM CHLORIDE 0.9 % IV SOLN
Freq: Once | INTRAVENOUS | Status: AC
  Filled 2020-10-02: qty 250

## 2020-10-02 NOTE — Progress Notes (Signed)
1457- Patient tolerated Feraheme infusion well. Patient and vital signs stable. Patient discharged to home at this time.  

## 2020-10-09 ENCOUNTER — Other Ambulatory Visit: Payer: Self-pay

## 2020-10-09 ENCOUNTER — Inpatient Hospital Stay: Payer: Managed Care, Other (non HMO)

## 2020-10-09 VITALS — BP 135/76 | HR 90 | Temp 97.0°F

## 2020-10-09 DIAGNOSIS — D509 Iron deficiency anemia, unspecified: Secondary | ICD-10-CM | POA: Diagnosis not present

## 2020-10-09 MED ORDER — SODIUM CHLORIDE 0.9 % IV SOLN
510.0000 mg | Freq: Once | INTRAVENOUS | Status: AC
  Administered 2020-10-09: 510 mg via INTRAVENOUS
  Filled 2020-10-09: qty 510

## 2020-10-09 MED ORDER — SODIUM CHLORIDE 0.9 % IV SOLN
Freq: Once | INTRAVENOUS | Status: AC
  Filled 2020-10-09: qty 250

## 2020-10-09 NOTE — Progress Notes (Signed)
Pt tolerated infusion well with no complications. VSS. Pt stable for discharge.   Ryan Bonilla  

## 2021-01-09 ENCOUNTER — Other Ambulatory Visit: Payer: Self-pay | Admitting: Oncology

## 2021-01-10 NOTE — Progress Notes (Signed)
[facility]  Telephone:(336) [phone] Fax:(336) [phone]  ID: Ryan Bonilla OB: [birth year]  MR#: [id number]  OXB#:[id number]  Patient Care Team: Gracelyn Nurse, MD as PCP - General (Internal Medicine) Jeralyn Ruths, MD as Consulting Physician (Hematology and Oncology)  CHIEF COMPLAINT: Iron deficiency anemia.  INTERVAL HISTORY: Patient returns to clinic today for repeat laboratory work, further evaluation, and consideration of additional Feraheme.  He continues to feel well and remains asymptomatic.  He does not complain of any weakness or fatigue.  He continues to be active and work full-time.  He has no neurologic complaints.  He denies any recent fevers or illnesses.  He has a good appetite and denies weight loss.  He has no chest pain, shortness of breath, cough, or hemoptysis.  He denies any nausea, vomiting, constipation, or diarrhea.  He denies any melena or hematochezia.  He has no urinary complaints.  Patient offers no specific complaints today.  REVIEW OF SYSTEMS:   Review of Systems  Constitutional: Negative.  Negative for fever, malaise/fatigue and weight loss.  Respiratory: Negative.  Negative for cough, hemoptysis and shortness of breath.   Cardiovascular: Negative.  Negative for chest pain and leg swelling.  Gastrointestinal: Negative.  Negative for abdominal pain, blood in stool and melena.  Genitourinary: Negative.  Negative for hematuria.  Musculoskeletal: Negative.  Negative for back pain.  Skin: Negative.  Negative for rash.  Neurological: Negative.  Negative for dizziness, focal weakness, weakness and headaches.  Psychiatric/Behavioral: Negative.  The patient is not nervous/anxious.     As per HPI. Otherwise, a complete review of systems is negative.  PAST MEDICAL HISTORY: Past Medical History:  Diagnosis Date  . Esophageal stricture   . Hyperlipidemia   . Hypertension   . Kidney stones     PAST SURGICAL HISTORY: Past Surgical  History:  Procedure Laterality Date  . COLONOSCOPY    . COLONOSCOPY WITH PROPOFOL N/A 10/10/2015   Procedure: COLONOSCOPY WITH PROPOFOL;  Surgeon: Wallace Cullens, MD;  Location: [facility] ENDOSCOPY;  Service: Gastroenterology;  Laterality: N/A;  . ESOPHAGOGASTRODUODENOSCOPY    . ESOPHAGOGASTRODUODENOSCOPY (EGD) WITH PROPOFOL N/A 10/10/2015   Procedure: ESOPHAGOGASTRODUODENOSCOPY (EGD) WITH PROPOFOL;  Surgeon: Wallace Cullens, MD;  Location: [facility] ENDOSCOPY;  Service: Gastroenterology;  Laterality: N/A;    FAMILY HISTORY: Family History  Problem Relation Age of Onset  . Hypertension Father     ADVANCED DIRECTIVES (Y/N):  N  HEALTH MAINTENANCE: Social History   Tobacco Use  . Smoking status: Never Smoker  . Smokeless tobacco: Never Used  Substance Use Topics  . Alcohol use: Not Currently    Alcohol/week: 0.0 standard drinks  . Drug use: Not Currently     Colonoscopy:  PAP:  Bone density:  Lipid panel:  Allergies  Allergen Reactions  . Penicillins     Current Outpatient Medications  Medication Sig Dispense Refill  . atorvastatin (LIPITOR) 10 MG tablet Take 10 mg by mouth daily.    . fluticasone (FLONASE) 50 MCG/ACT nasal spray USE 2 SPRAYS IN EACH  NOSTRIL DAILY (Patient taking differently: Place 2 sprays into both nostrils daily.) 48 g 3  . gemfibrozil (LOPID) 600 MG tablet Take 600 mg by mouth 2 (two) times daily before a meal.    . lisinopril (PRINIVIL,ZESTRIL) 10 MG tablet Take 10 mg by mouth daily.     No current facility-administered medications for this visit.    OBJECTIVE: Vitals:   01/16/21 1410  BP: 132/73  Pulse: 88  Resp: 16  Temp: 98.4 F (36.9 C)     Body mass index is 23.34 kg/m.    ECOG FS:0 - Asymptomatic  General: Well-developed, well-nourished, no acute distress. Eyes: Pink conjunctiva, anicteric sclera. HEENT: Normocephalic, moist mucous membranes. Lungs: No audible wheezing or coughing. Heart: Regular rate and rhythm. Abdomen: Soft, nontender, no  obvious distention. Musculoskeletal: No edema, cyanosis, or clubbing. Neuro: Alert, answering all questions appropriately. Cranial nerves grossly intact. Skin: No rashes or petechiae noted. Psych: Normal affect.  LAB RESULTS:  Lab Results  Component Value Date   NA 138 12/18/2019   K 3.9 12/18/2019   CL 104 12/18/2019   CO2 28 12/18/2019   GLUCOSE 79 06/12/2020   BUN 17 12/18/2019   CREATININE 0.93 12/18/2019   CALCIUM 9.5 12/18/2019   PROT 7.9 12/18/2019   ALBUMIN 4.3 12/18/2019   AST 18 12/18/2019   ALT 18 12/18/2019   ALKPHOS 73 12/18/2019   BILITOT 0.7 12/18/2019   GFRNONAA >60 12/18/2019   GFRAA >60 12/18/2019    Lab Results  Component Value Date   WBC 6.2 01/15/2021   NEUTROABS 4.1 01/15/2021   HGB 14.2 01/15/2021   HCT 41.1 01/15/2021   MCV 89.3 01/15/2021   PLT 432 (H) 01/15/2021   Lab Results  Component Value Date   IRON 101 01/15/2021   TIBC 564 (H) 01/15/2021   IRONPCTSAT 18 01/15/2021   Lab Results  Component Value Date   FERRITIN 20 (L) 01/15/2021     STUDIES: No results found.  ASSESSMENT: Iron deficiency anemia.  PLAN:    1. Iron deficiency anemia: Patient's hemoglobin and iron stores are now essentially within normal limits.  Ferritin remains mildly reduced, but significantly improved from previous. Colonoscopy and EGD in November 2021 were reported as normal with no significant pathology.  Patient was noted to have mild gastritis.  No intervention is needed at this time.  Patient last received IV Feraheme on October 09, 2020.  Continue oral iron supplementation.  Return to clinic in 4 months with repeat laboratory work, further evaluation, and consideration of additional treatment if needed. 2.  Thrombocytosis: Improved.  Likely secondary to history of iron deficiency, monitor.  I spent a total of 20 minutes reviewing chart data, face-to-face evaluation with the patient, counseling and coordination of care as detailed above.   Patient  expressed understanding and was in agreement with this plan. He also understands that He can call clinic at any time with any questions, concerns, or complaints.     Jeralyn Ruths, MD   01/16/2021 6:20 PM

## 2021-01-15 ENCOUNTER — Other Ambulatory Visit: Payer: Self-pay | Admitting: *Deleted

## 2021-01-15 ENCOUNTER — Other Ambulatory Visit: Payer: Self-pay

## 2021-01-15 ENCOUNTER — Inpatient Hospital Stay: Payer: Managed Care, Other (non HMO) | Attending: Oncology

## 2021-01-15 DIAGNOSIS — I10 Essential (primary) hypertension: Secondary | ICD-10-CM | POA: Insufficient documentation

## 2021-01-15 DIAGNOSIS — Z79899 Other long term (current) drug therapy: Secondary | ICD-10-CM | POA: Insufficient documentation

## 2021-01-15 DIAGNOSIS — Z8249 Family history of ischemic heart disease and other diseases of the circulatory system: Secondary | ICD-10-CM | POA: Insufficient documentation

## 2021-01-15 DIAGNOSIS — D509 Iron deficiency anemia, unspecified: Secondary | ICD-10-CM

## 2021-01-15 LAB — CBC WITH DIFFERENTIAL/PLATELET
Abs Immature Granulocytes: 0.01 10*3/uL (ref 0.00–0.07)
Basophils Absolute: 0 10*3/uL (ref 0.0–0.1)
Basophils Relative: 0 %
Eosinophils Absolute: 0.2 10*3/uL (ref 0.0–0.5)
Eosinophils Relative: 3 %
HCT: 41.1 % (ref 39.0–52.0)
Hemoglobin: 14.2 g/dL (ref 13.0–17.0)
Immature Granulocytes: 0 %
Lymphocytes Relative: 24 %
Lymphs Abs: 1.5 10*3/uL (ref 0.7–4.0)
MCH: 30.9 pg (ref 26.0–34.0)
MCHC: 34.5 g/dL (ref 30.0–36.0)
MCV: 89.3 fL (ref 80.0–100.0)
Monocytes Absolute: 0.4 10*3/uL (ref 0.1–1.0)
Monocytes Relative: 7 %
Neutro Abs: 4.1 10*3/uL (ref 1.7–7.7)
Neutrophils Relative %: 66 %
Platelets: 432 10*3/uL — ABNORMAL HIGH (ref 150–400)
RBC: 4.6 MIL/uL (ref 4.22–5.81)
RDW: 14.6 % (ref 11.5–15.5)
WBC: 6.2 10*3/uL (ref 4.0–10.5)
nRBC: 0 % (ref 0.0–0.2)

## 2021-01-15 LAB — IRON AND TIBC
Iron: 101 ug/dL (ref 45–182)
Saturation Ratios: 18 % (ref 17.9–39.5)
TIBC: 564 ug/dL — ABNORMAL HIGH (ref 250–450)
UIBC: 463 ug/dL

## 2021-01-15 LAB — FERRITIN: Ferritin: 20 ng/mL — ABNORMAL LOW (ref 24–336)

## 2021-01-16 ENCOUNTER — Inpatient Hospital Stay: Payer: Managed Care, Other (non HMO) | Admitting: Oncology

## 2021-01-16 ENCOUNTER — Encounter: Payer: Self-pay | Admitting: Oncology

## 2021-01-16 ENCOUNTER — Inpatient Hospital Stay: Payer: Managed Care, Other (non HMO)

## 2021-01-16 VITALS — BP 132/73 | HR 88 | Temp 98.4°F | Resp 16 | Wt 149.0 lb

## 2021-01-16 DIAGNOSIS — D509 Iron deficiency anemia, unspecified: Secondary | ICD-10-CM | POA: Diagnosis not present

## 2021-01-16 NOTE — Progress Notes (Signed)
Patient here for follow up no concerns today. 

## 2021-03-12 ENCOUNTER — Ambulatory Visit: Payer: Managed Care, Other (non HMO) | Admitting: Nurse Practitioner

## 2021-03-12 VITALS — BP 132/76 | HR 72 | Temp 98.0°F | Resp 16 | Ht 67.0 in | Wt 140.0 lb

## 2021-03-12 DIAGNOSIS — Z008 Encounter for other general examination: Secondary | ICD-10-CM

## 2021-03-12 DIAGNOSIS — Z0000 Encounter for general adult medical examination without abnormal findings: Secondary | ICD-10-CM | POA: Diagnosis not present

## 2021-03-12 NOTE — Patient Instructions (Addendum)
Immunization Schedule, 35-63 Years Old  Vaccines are usually given at various ages, according to a schedule. Your health care provider will recommend vaccines for you based on your age, medical history, and lifestyle or other factors such as travel or where you work. You may receive vaccines as individual doses or as more than one vaccine together in one shot (combination vaccines). Talk with your health care provider about the risks and benefits of combination vaccines. Recommended immunizations for 110-1 years old Influenza vaccine  You should get a dose of the influenza vaccine every year. Tetanus, diphtheria, and pertussis vaccine A vaccine that protects against tetanus, diphtheria, and pertussis is known as the Tdap vaccine. A vaccine that protects against tetanus and diphtheria is known as the Td vaccine.  You should only get the Td vaccine if you have had at least 1 dose of the Tdap vaccine.  You should get 1 dose of the Td or Tdap vaccine every 10 years, or you should get 1 dose of the Tdap vaccine if: ? You have not previously gotten a Tdap vaccine. ? You do not know if you have ever gotten a Tdap vaccine. Zoster vaccine This is also known as the RZV vaccine. You should get 2 doses of the RZV vaccine 2 to 6 months apart. It is important to get the RZV vaccine even if you:  Have had shingles.  Have received the ZVL vaccine, an older version of the RZV vaccine.  Are unsure if you have had chickenpox (varicella). Pneumococcal conjugate vaccine This is also known as the PCV13 vaccine. You should get the PCV13 vaccine as recommended if you have certain high-risk conditions. These include:  Diabetes.  Chronic conditions of the heart, lungs, or liver.  Conditions that affect the body's disease-fighting system (immune system). Pneumococcal polysaccharide vaccine This is also known as the PPSV23 vaccine. You should get the PPSV23 vaccine as recommended if you have certain high-risk  conditions. These include:  Diabetes.  Chronic conditions of the heart, lungs, or liver.  Conditions that affect the immune system. Hepatitis A vaccine This is also known as the HepA vaccine. If you did not get the HepA vaccine previously, you should get it if:  You are at risk for a hepatitis A infection. You may be at risk for infection if you: ? Have chronic liver disease. ? Have HIV or AIDS. ? Are a man who has sex with men. ? Use drugs. ? Are homeless. ? May be exposed to hepatitis A through work. ? Travel to countries where hepatitis A is common. ? Have or will have close contact with someone who was adopted from another country.  You are not at risk for infection but want protection from hepatitis A. Hepatitis B vaccine This is also known as the HepB vaccine. If you did not get the HepB vaccine previously, you should get it if:  You are at risk for hepatitis B infection. You are at risk if you: ? Have chronic liver disease. ? Have HIV or AIDS. ? Have sex with a partner who has hepatitis B, or:  You have multiple sex partners.  You are a man who has sex with men. ? Use drugs. ? May be exposed to hepatitis B through work. ? Live with someone who has hepatitis B. ? Receive dialysis treatment. ? Have diabetes. ? Travel to countries where hepatitis B is common.  You are not at risk of infection but want protection from hepatitis B. Measles, mumps, and  rubella vaccine This is also known as the MMR vaccine. You may need to get the MMR vaccine if you were born in 1957 or later and:  You need to catch up on doses you missed in the past.  You have not been given the vaccine before.  You do not have evidence of immunity (by a blood test). Varicella vaccine This is also known as the VAR vaccine. You may need to get the VAR vaccine if you do not have evidence of immunity (by a blood test) and you may be exposed to varicella through work. This is especially important if you  work in a health care setting. Meningococcal conjugate vaccine This is also known as the MenACWY vaccine. You may need to get the MenACWY vaccine if you:  Have not been given the vaccine before.  Need to catch up on doses you missed in the past. This vaccine is especially important if you:  Do not have a spleen.  Have sickle cell disease.  Have HIV.  Take medicines that suppress your immune system.  Travel to countries where meningococcal disease is common.  Are exposed to Neisseria meningitidis at work. Serogroup B meningococcal vaccine This is also known as the MenB vaccine. You may need to get the MenB vaccine if you:  Have not been given the vaccine before.  Need to catch up on doses you missed in the past. This vaccine is especially important if you:  Do not have a spleen.  Have sickle cell disease.  Take medicines that suppress your immune system.  Are exposed to Neisseria meningitidis at work. Haemophilus influenzae type b vaccine This is also known as the Hib vaccine. Anyone older than 63 years of age is usually not given the Hib vaccine. However, if you have certain high-risk conditions, you may need to get this vaccine. These conditions include:  Not having a spleen.  Having received a stem cell transplant. Before you get a vaccine: Talk with your health care provider about which vaccines are right for you. This is especially important if:  You previously had a reaction after getting a vaccine.  You have a weakened immune system. You may have a weakened immune system if you: ? Are taking medicines that reduce (suppress) the activity of your immune system. ? Are taking medicines to treat cancer (chemotherapy). ? Have HIV or AIDS.  You work in an environment where you may be exposed to a disease.  You plan to travel outside of the country.  You have a chronic illness, such as heart disease, kidney disease, diabetes, or lung disease. Summary  Before you  get a vaccine, tell your health care provider if you have reacted to vaccines in the past or have a condition that weakens your immune system.  At 50-64 years, you should get a dose of the flu vaccine every year and a dose of the Td or Tdap vaccine every 10 years.  You should get 2 doses of the RZV vaccine 2 to 6 months apart.  Depending on your medical history and your risk factors, you may need other vaccines. Ask your health care provider whether you are up to date on all your vaccines. This information is not intended to replace advice given to you by your health care provider. Make sure you discuss any questions you have with your health care provider. Document Revised: 08/08/2019 Document Reviewed: 08/08/2019 Elsevier Patient Education  [place] Maintenance, Male Adopting a healthy lifestyle and  getting preventive care are important in promoting health and wellness. Ask your health care provider about:  The right schedule for you to have regular tests and exams.  Things you can do on your own to prevent diseases and keep yourself healthy. What should I know about diet, weight, and exercise? Eat a healthy diet  Eat a diet that includes plenty of vegetables, fruits, low-fat dairy products, and lean protein.  Do not eat a lot of foods that are high in solid fats, added sugars, or sodium.   Maintain a healthy weight Body mass index (BMI) is a measurement that can be used to identify possible weight problems. It estimates body fat based on height and weight. Your health care provider can help determine your BMI and help you achieve or maintain a healthy weight. Get regular exercise Get regular exercise. This is one of the most important things you can do for your health. Most adults should:  Exercise for at least 150 minutes each week. The exercise should increase your heart rate and make you sweat (moderate-intensity exercise).  Do strengthening exercises at least  twice a week. This is in addition to the moderate-intensity exercise.  Spend less time sitting. Even light physical activity can be beneficial. Watch cholesterol and blood lipids Have your blood tested for lipids and cholesterol at 63 years of age, then have this test every 5 years. You may need to have your cholesterol levels checked more often if:  Your lipid or cholesterol levels are high.  You are older than 63 years of age.  You are at high risk for heart disease. What should I know about cancer screening? Many types of cancers can be detected early and may often be prevented. Depending on your health history and family history, you may need to have cancer screening at various ages. This may include screening for:  Colorectal cancer.  Prostate cancer.  Skin cancer.  Lung cancer. What should I know about heart disease, diabetes, and high blood pressure? Blood pressure and heart disease  High blood pressure causes heart disease and increases the risk of stroke. This is more likely to develop in people who have high blood pressure readings, are of African descent, or are overweight.  Talk with your health care provider about your target blood pressure readings.  Have your blood pressure checked: ? Every 3-5 years if you are 2-3 years of age. ? Every year if you are 87 years old or older.  If you are between the ages of 62 and 61 and are a current or former smoker, ask your health care provider if you should have a one-time screening for abdominal aortic aneurysm (AAA). Diabetes Have regular diabetes screenings. This checks your fasting blood sugar level. Have the screening done:  Once every three years after age 37 if you are at a normal weight and have a low risk for diabetes.  More often and at a younger age if you are overweight or have a high risk for diabetes. What should I know about preventing infection? Hepatitis B If you have a higher risk for hepatitis B, you  should be screened for this virus. Talk with your health care provider to find out if you are at risk for hepatitis B infection. Hepatitis C Blood testing is recommended for:  Everyone born from [birth year] through 1965.  Anyone with known risk factors for hepatitis C. Sexually transmitted infections (STIs)  You should be screened each year for STIs, including gonorrhea and chlamydia,  if: ? You are sexually active and are younger than 63 years of age. ? You are older than 63 years of age and your health care provider tells you that you are at risk for this type of infection. ? Your sexual activity has changed since you were last screened, and you are at increased risk for chlamydia or gonorrhea. Ask your health care provider if you are at risk.  Ask your health care provider about whether you are at high risk for HIV. Your health care provider may recommend a prescription medicine to help prevent HIV infection. If you choose to take medicine to prevent HIV, you should first get tested for HIV. You should then be tested every 3 months for as long as you are taking the medicine. Follow these instructions at home: Lifestyle  Do not use any products that contain nicotine or tobacco, such as cigarettes, e-cigarettes, and chewing tobacco. If you need help quitting, ask your health care provider.  Do not use street drugs.  Do not share needles.  Ask your health care provider for help if you need support or information about quitting drugs. Alcohol use  Do not drink alcohol if your health care provider tells you not to drink.  If you drink alcohol: ? Limit how much you have to 0-2 drinks a day. ? Be aware of how much alcohol is in your drink. In the [place] one drink equals one 12 oz bottle of beer (355 mL), one 5 oz glass of wine (148 mL), or one 1 oz glass of hard liquor (44 mL). General instructions  Schedule regular health, dental, and eye exams.  Stay current with your vaccines.  Tell your  health care provider if: ? You often feel depressed. ? You have ever been abused or do not feel safe at home. Summary  Adopting a healthy lifestyle and getting preventive care are important in promoting health and wellness.  Follow your health care provider's instructions about healthy diet, exercising, and getting tested or screened for diseases.  Follow your health care provider's instructions on monitoring your cholesterol and blood pressure. This information is not intended to replace advice given to you by your health care provider. Make sure you discuss any questions you have with your health care provider. Document Revised: 10/05/2018 Document Reviewed: 10/05/2018 Elsevier Patient Education  2021 [employer].

## 2021-03-12 NOTE — Progress Notes (Signed)
Subjective:    Patient ID: Ryan Bonilla, male    DOB: June 25, 1956, 65 y.o.   MRN: [id number]  HPI Ryan Bonilla is a 65 y.o. male employed for the [employer] department. 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 He enjoys his job. He has no complaints today.   Past Medical History:  Diagnosis Date  . Esophageal stricture   . Hyperlipidemia   . Hypertension   . Kidney stones    Only change in PMH was tested Pos for Covid 12/21 He did have the vaccine  Past Surgical History:  Procedure Laterality Date  . COLONOSCOPY    . COLONOSCOPY WITH PROPOFOL N/A 10/10/2015   Procedure: COLONOSCOPY WITH PROPOFOL;  Surgeon: Wallace Cullens, MD;  Location: [facility] ENDOSCOPY;  Service: Gastroenterology;  Laterality: N/A;  . ESOPHAGOGASTRODUODENOSCOPY    . ESOPHAGOGASTRODUODENOSCOPY (EGD) WITH PROPOFOL N/A 10/10/2015   Procedure: ESOPHAGOGASTRODUODENOSCOPY (EGD) WITH PROPOFOL;  Surgeon: Wallace Cullens, MD;  Location: [facility] ENDOSCOPY;  Service: Gastroenterology;  Laterality: N/A;   Current Outpatient Medications on File Prior to Visit  Medication Sig Dispense Refill  . atorvastatin (LIPITOR) 10 MG tablet Take 10 mg by mouth daily.    . fluticasone (FLONASE) 50 MCG/ACT nasal spray USE 2 SPRAYS IN EACH  NOSTRIL DAILY (Patient taking differently: Place 2 sprays into both nostrils daily.) 48 g 3  . gemfibrozil (LOPID) 600 MG tablet Take 600 mg by mouth 2 (two) times daily before a meal.    . lisinopril (PRINIVIL,ZESTRIL) 10 MG tablet Take 10 mg by mouth daily.     No current facility-administered medications on file prior to visit.   Allergies  Allergen Reactions  . Penicillins    SH: Married and lives with wife, feels safe at home, denies use of tobacco, ETOH or drugs.   Immunizations: UTD  PCP Dr[facility] Laural Benes at [facility]  Review of Systems  All other systems reviewed and are negative. 10 systems reviewed and are negative     Objective: BP 132/76 (BP Location:  Left Arm, Patient Position: Sitting, Cuff Size: Small)   Pulse 72   Temp 98 F (36.7 C) (Temporal)   Resp 16   Ht 5\' 7"  (1.702 m)   Wt 140 lb (63.5 kg)   SpO2 100%   BMI 21.93 kg/m     Physical Exam Vitals and nursing note reviewed.  Constitutional:      Appearance: Normal appearance. He is well-developed, well-groomed and normal weight.  HENT:     Head: Normocephalic.     Jaw: There is normal jaw occlusion.     Right Ear: Tympanic membrane, ear canal and external ear normal.     Left Ear: Hearing, tympanic membrane and ear canal normal.  Eyes:     General: Lids are normal. No visual field deficit.    Extraocular Movements:     Right eye: Normal extraocular motion and no nystagmus.     Left eye: Normal extraocular motion and no nystagmus.     Conjunctiva/sclera: Conjunctivae normal.  Neck:     Thyroid: No thyroid mass or thyroid tenderness.     Vascular: Normal carotid pulses. No carotid bruit or JVD.     Trachea: Trachea normal.  Cardiovascular:     Rate and Rhythm: Normal rate and regular rhythm.     Pulses:          Carotid pulses are 2+ on the right side and 2+ on the left  side.      Radial pulses are 2+ on the right side and 2+ on the left side.       Dorsalis pedis pulses are 2+ on the right side and 2+ on the left side.       Posterior tibial pulses are 2+ on the right side and 2+ on the left side.     Heart sounds: No murmur heard.   Pulmonary:     Effort: Pulmonary effort is normal.     Breath sounds: Normal breath sounds.  Abdominal:     General: Abdomen is flat. Bowel sounds are normal.     Palpations: Abdomen is soft.     Tenderness: There is no abdominal tenderness. There is no right CVA tenderness or left CVA tenderness.  Musculoskeletal:     Cervical back: Normal range of motion. No rigidity. No pain with movement, spinous process tenderness or muscular tenderness.     Right lower leg: No edema.     Left lower leg: No edema.  Lymphadenopathy:      Cervical: No cervical adenopathy.  Skin:    General: Skin is warm and dry.  Neurological:     Mental Status: He is alert.     Cranial Nerves: No facial asymmetry.     Motor: No weakness, tremor or pronator drift.     Coordination: Romberg sign negative. Finger-Nose-Finger Test and Heel to [place] Test normal.     Gait: Gait is intact.     Comments: Ambulatory with steady gait, stands on one foot without difficulty. Grips are equal.  Psychiatric:        Attention and Perception: Attention normal.        Mood and Affect: Mood normal.        Speech: Speech normal.        Behavior: Behavior is cooperative.       Assessment & Plan:  1. Encounter for other general examination  2. Encounter for biometric screening - Glucose, random - Lipid panel  3. Encounter for preventive health examination  65 y.o. male here for annual biometric screening exam without complaints. Evaluated yearly by PCP and management of medications. Discussed with the patient clinical findings and plan of care. Patient given the opportunity to ask questions.All questioned fully answered and patient voices understanding. He will follow up with his PCP or return to clinic as needed.

## 2021-03-13 LAB — LIPID PANEL
Chol/HDL Ratio: 3.7 ratio (ref 0.0–5.0)
Cholesterol, Total: 148 mg/dL (ref 100–199)
HDL: 40 mg/dL (ref >39)
LDL Chol Calc (NIH): 92 mg/dL (ref 0–99)
Triglycerides: 82 mg/dL (ref 0–149)
VLDL Cholesterol Cal: 16 mg/dL (ref 5–40)

## 2021-03-13 LAB — GLUCOSE, RANDOM: Glucose: 70 mg/dL (ref 65–99)

## 2021-03-27 ENCOUNTER — Other Ambulatory Visit: Payer: Self-pay | Admitting: Gastroenterology

## 2021-03-27 DIAGNOSIS — R1013 Epigastric pain: Secondary | ICD-10-CM

## 2021-04-04 ENCOUNTER — Ambulatory Visit: Payer: Managed Care, Other (non HMO)

## 2021-04-18 ENCOUNTER — Ambulatory Visit: Payer: Managed Care, Other (non HMO)

## 2021-04-21 ENCOUNTER — Ambulatory Visit: Payer: Managed Care, Other (non HMO)

## 2021-04-24 ENCOUNTER — Encounter: Payer: Self-pay | Admitting: Podiatry

## 2021-04-24 ENCOUNTER — Other Ambulatory Visit: Payer: Self-pay

## 2021-04-24 ENCOUNTER — Ambulatory Visit: Payer: Managed Care, Other (non HMO) | Admitting: Podiatry

## 2021-04-24 ENCOUNTER — Ambulatory Visit (INDEPENDENT_AMBULATORY_CARE_PROVIDER_SITE_OTHER): Payer: Managed Care, Other (non HMO)

## 2021-04-24 DIAGNOSIS — M722 Plantar fascial fibromatosis: Secondary | ICD-10-CM

## 2021-04-24 NOTE — Progress Notes (Signed)
  Subjective:  Patient ID: Ryan Bonilla, male    DOB: [birth year],  MRN: [id number]  Chief Complaint  Patient presents with   Foot Pain    Left heel pain     65 y.o. male presents with the above complaint.  Patient presents with complaint left heel pain that has been going on for quite some time has progressed to gotten worse.  Patient states it became really started when he went to the beach.  He states that it seems like he might be Planter fasciitis.  She does not have a history of it he has had family members that have had plantar fasciitis.  He would like to get evaluated he has not seen anyone else prior to seeing me.  He denies any other acute complaints.   Review of Systems: Negative except as noted in the HPI. Denies N/V/F/Ch.  Past Medical History:  Diagnosis Date   Esophageal stricture    Hyperlipidemia    Hypertension    Kidney stones     Current Outpatient Medications:    atorvastatin (LIPITOR) 10 MG tablet, Take 10 mg by mouth daily., Disp: , Rfl:    fluticasone (FLONASE) 50 MCG/ACT nasal spray, USE 2 SPRAYS IN EACH  NOSTRIL DAILY (Patient taking differently: Place 2 sprays into both nostrils daily.), Disp: 48 g, Rfl: 3   gemfibrozil (LOPID) 600 MG tablet, Take 600 mg by mouth 2 (two) times daily before a meal., Disp: , Rfl:    lisinopril (PRINIVIL,ZESTRIL) 10 MG tablet, Take 10 mg by mouth daily., Disp: , Rfl:   Social History   Tobacco Use  Smoking Status Never  Smokeless Tobacco Never    Allergies  Allergen Reactions   Penicillins    Objective:  There were no vitals filed for this visit. There is no height or weight on file to calculate BMI. Constitutional Well developed. Well nourished.  Vascular Dorsalis pedis pulses palpable bilaterally. Posterior tibial pulses palpable bilaterally. Capillary refill normal to all digits.  No cyanosis or clubbing noted. Pedal hair growth normal.  Neurologic Normal speech. Oriented to person, place, and  time. Epicritic sensation to light touch grossly present bilaterally.  Dermatologic Nails well groomed and normal in appearance. No open wounds. No skin lesions.  Orthopedic: Normal joint ROM without pain or crepitus bilaterally. No visible deformities. Tender to palpation at the calcaneal tuber left. No pain with calcaneal squeeze left. Ankle ROM diminished range of motion left. Silf[place]old Test: negative left.   Radiographs: Taken and reviewed. No acute fractures or dislocations. No evidence of stress fracture.  Plantar heel spur absent. Posterior heel spur absent.   Assessment:   1. Plantar fasciitis of left foot    Plan:  Patient was evaluated and treated and all questions answered.  Plantar Fasciitis, left - XR reviewed as above.  - Educated on icing and stretching. Instructions given.  - Injection delivered to the plantar fascia as below. - DME: Plantar Fascial Brace - Pharmacologic management: None  Procedure: Injection Tendon/Ligament Location: Left plantar fascia at the glabrous junction; medial approach. Skin Prep: alcohol Injectate: 0.5 cc 0.5% marcaine plain, 0.5 cc of 1% Lidocaine, 0.5 cc kenalog 10. Disposition: Patient tolerated procedure well. Injection site dressed with a band-aid.  No follow-ups on file.

## 2021-04-25 ENCOUNTER — Ambulatory Visit
Admission: RE | Admit: 2021-04-25 | Discharge: 2021-04-25 | Disposition: A | Discharge status: Home / Self Care | Payer: Managed Care, Other (non HMO) | Source: Ambulatory Visit | Attending: Gastroenterology | Admitting: Gastroenterology

## 2021-04-25 DIAGNOSIS — R1013 Epigastric pain: Secondary | ICD-10-CM | POA: Insufficient documentation

## 2021-04-25 MED ORDER — TECHNETIUM TC 99M SULFUR COLLOID
2.1600 | Freq: Once | INTRAVENOUS | Status: AC | PRN
  Administered 2021-04-25: 2.16 via ORAL

## 2021-05-16 ENCOUNTER — Other Ambulatory Visit: Payer: Managed Care, Other (non HMO)

## 2021-05-19 ENCOUNTER — Ambulatory Visit: Payer: Managed Care, Other (non HMO)

## 2021-05-19 ENCOUNTER — Ambulatory Visit: Payer: Managed Care, Other (non HMO) | Admitting: Oncology

## 2021-05-22 ENCOUNTER — Other Ambulatory Visit: Payer: Self-pay

## 2021-05-22 ENCOUNTER — Encounter: Payer: Self-pay | Admitting: Podiatry

## 2021-05-22 ENCOUNTER — Ambulatory Visit: Payer: Managed Care, Other (non HMO) | Admitting: Podiatry

## 2021-05-22 DIAGNOSIS — Q666 Other congenital valgus deformities of feet: Secondary | ICD-10-CM | POA: Diagnosis not present

## 2021-05-22 DIAGNOSIS — M21961 Unspecified acquired deformity of right lower leg: Secondary | ICD-10-CM | POA: Diagnosis not present

## 2021-05-22 DIAGNOSIS — M722 Plantar fascial fibromatosis: Secondary | ICD-10-CM | POA: Diagnosis not present

## 2021-05-22 DIAGNOSIS — M21962 Unspecified acquired deformity of left lower leg: Secondary | ICD-10-CM

## 2021-05-22 DIAGNOSIS — D509 Iron deficiency anemia, unspecified: Secondary | ICD-10-CM

## 2021-05-22 NOTE — Progress Notes (Signed)
Subjective:  Patient ID: Ryan Bonilla, male    DOB: Aug 25, 1956,  MRN: [id number]  Chief Complaint  Patient presents with   Plantar Fasciitis    PT stated that he is still having some pain he has good days and bad days     65 y.o. male presents with the above complaint.  Patient presents for follow-up on left Planter fasciitis.  Patient states that he is doing a little bit better.  The injection helped the bracing helped a little bit.  He would like to discuss further treatment options he denies any other acute complaints.  He has been doing stretching exercises.   Review of Systems: Negative except as noted in the HPI. Denies N/V/F/Ch.  Past Medical History:  Diagnosis Date   Esophageal stricture    Hyperlipidemia    Hypertension    Kidney stones     Current Outpatient Medications:    atorvastatin (LIPITOR) 10 MG tablet, Take 10 mg by mouth daily., Disp: , Rfl:    fluticasone (FLONASE) 50 MCG/ACT nasal spray, USE 2 SPRAYS IN EACH  NOSTRIL DAILY (Patient taking differently: Place 2 sprays into both nostrils daily.), Disp: 48 g, Rfl: 3   gemfibrozil (LOPID) 600 MG tablet, Take 600 mg by mouth 2 (two) times daily before a meal., Disp: , Rfl:    lisinopril (PRINIVIL,ZESTRIL) 10 MG tablet, Take 10 mg by mouth daily., Disp: , Rfl:   Social History   Tobacco Use  Smoking Status Never  Smokeless Tobacco Never    Allergies  Allergen Reactions   Penicillins    Objective:  There were no vitals filed for this visit. There is no height or weight on file to calculate BMI. Constitutional Well developed. Well nourished.  Vascular Dorsalis pedis pulses palpable bilaterally. Posterior tibial pulses palpable bilaterally. Capillary refill normal to all digits.  No cyanosis or clubbing noted. Pedal hair growth normal.  Neurologic Normal speech. Oriented to person, place, and time. Epicritic sensation to light touch grossly present bilaterally.  Dermatologic Nails well groomed and  normal in appearance. No open wounds. No skin lesions.  Orthopedic: Normal joint ROM without pain or crepitus bilaterally. No visible deformities. Tender to palpation at the calcaneal tuber left. No pain with calcaneal squeeze left. Ankle ROM diminished range of motion left. Silfverskiold Test: negative left.   Radiographs: Taken and reviewed. No acute fractures or dislocations. No evidence of stress fracture.  Plantar heel spur absent. Posterior heel spur absent.   Assessment:   1. Plantar fasciitis of left foot   2. Pes planovalgus   3. Deformity of both feet     Plan:  Patient was evaluated and treated and all questions answered.  Plantar Fasciitis, left - XR reviewed as above.  - Educated on icing and stretching. Instructions given.  - Injection delivered to the plantar fascia as below. - DME: Night splint - Pharmacologic management: None  Pes planovalgus/foot deformity -I explained to the patient the etiology of pes planovalgus and various treatment options were extensively discussed.  Given the type of pain that is having from Planter fasciitis as well as the underlying foot deformity I believe patient will benefit from custom-made orthotics to help control the hindfoot motion support the arch of the foot take the stress away from plantar fascial.  Patient states understanding.  Procedure: Injection Tendon/Ligament Location: Left plantar fascia at the glabrous junction; medial approach. Skin Prep: alcohol Injectate: 0.5 cc 0.5% marcaine plain, 0.5 cc of 1% Lidocaine, 0.5 cc kenalog 10.  Disposition: Patient tolerated procedure well. Injection site dressed with a band-aid.  No follow-ups on file.

## 2021-05-23 ENCOUNTER — Inpatient Hospital Stay: Payer: Managed Care, Other (non HMO) | Attending: Oncology

## 2021-05-23 DIAGNOSIS — D509 Iron deficiency anemia, unspecified: Secondary | ICD-10-CM | POA: Diagnosis present

## 2021-05-23 LAB — IRON AND TIBC
Iron: 25 ug/dL — ABNORMAL LOW (ref 45–182)
Saturation Ratios: 4 % — ABNORMAL LOW (ref 17.9–39.5)
TIBC: 624 ug/dL — ABNORMAL HIGH (ref 250–450)
UIBC: 599 ug/dL

## 2021-05-23 LAB — CBC WITH DIFFERENTIAL/PLATELET
Abs Immature Granulocytes: 0.04 10*3/uL (ref 0.00–0.07)
Basophils Absolute: 0 10*3/uL (ref 0.0–0.1)
Basophils Relative: 0 %
Eosinophils Absolute: 0.3 10*3/uL (ref 0.0–0.5)
Eosinophils Relative: 3 %
HCT: 38 % — ABNORMAL LOW (ref 39.0–52.0)
Hemoglobin: 12.5 g/dL — ABNORMAL LOW (ref 13.0–17.0)
Immature Granulocytes: 0 %
Lymphocytes Relative: 16 %
Lymphs Abs: 1.5 10*3/uL (ref 0.7–4.0)
MCH: 28.7 pg (ref 26.0–34.0)
MCHC: 32.9 g/dL (ref 30.0–36.0)
MCV: 87.4 fL (ref 80.0–100.0)
Monocytes Absolute: 0.4 10*3/uL (ref 0.1–1.0)
Monocytes Relative: 5 %
Neutro Abs: 7.3 10*3/uL (ref 1.7–7.7)
Neutrophils Relative %: 76 %
Platelets: 461 10*3/uL — ABNORMAL HIGH (ref 150–400)
RBC: 4.35 MIL/uL (ref 4.22–5.81)
RDW: 14.2 % (ref 11.5–15.5)
WBC: 9.5 10*3/uL (ref 4.0–10.5)
nRBC: 0 % (ref 0.0–0.2)

## 2021-05-23 LAB — FERRITIN: Ferritin: 6 ng/mL — ABNORMAL LOW (ref 24–336)

## 2021-05-26 ENCOUNTER — Inpatient Hospital Stay: Payer: Managed Care, Other (non HMO)

## 2021-05-26 ENCOUNTER — Inpatient Hospital Stay: Payer: Managed Care, Other (non HMO) | Attending: Oncology | Admitting: Oncology

## 2021-05-26 ENCOUNTER — Other Ambulatory Visit: Payer: Self-pay

## 2021-05-26 ENCOUNTER — Encounter: Payer: Self-pay | Admitting: Oncology

## 2021-05-26 VITALS — BP 137/80 | HR 96 | Temp 98.3°F | Resp 16 | Wt 143.5 lb

## 2021-05-26 VITALS — BP 125/68 | HR 81 | Temp 98.8°F | Resp 18

## 2021-05-26 DIAGNOSIS — D508 Other iron deficiency anemias: Secondary | ICD-10-CM | POA: Diagnosis present

## 2021-05-26 DIAGNOSIS — D75839 Thrombocytosis, unspecified: Secondary | ICD-10-CM | POA: Diagnosis not present

## 2021-05-26 DIAGNOSIS — D509 Iron deficiency anemia, unspecified: Secondary | ICD-10-CM | POA: Diagnosis not present

## 2021-05-26 MED ORDER — SODIUM CHLORIDE 0.9 % IV SOLN: 200.0000 mg | Freq: Once | INTRAVENOUS | Status: DC

## 2021-05-26 MED ORDER — IRON SUCROSE 20 MG/ML IV SOLN
200.0000 mg | Freq: Once | INTRAVENOUS | Status: AC
  Administered 2021-05-26: 200 mg via INTRAVENOUS
  Filled 2021-05-26: qty 10

## 2021-05-26 MED ORDER — SODIUM CHLORIDE 0.9 % IV SOLN
Freq: Once | INTRAVENOUS | Status: AC
  Filled 2021-05-26: qty 250

## 2021-05-26 NOTE — Progress Notes (Signed)
[facility]  Telephone:(336) [phone] Fax:(336) [phone]  ID: PERRI LAMAGNA OB: 28-May-1956  MR#: [id number]  KZS#:[id number]  Patient Care Team: Gracelyn Nurse, MD as PCP - General (Internal Medicine) Jeralyn Ruths, MD as Consulting Physician (Hematology and Oncology)  CHIEF COMPLAINT: Iron deficiency anemia.  INTERVAL HISTORY: Mr. Lomeli presents today for follow-up for his iron deficiency anemia.  He was last seen in clinic on 01/16/2021.   He last received IV Feraheme on 10/02/2020 and 10/09/2020.  He had a gastric emptying study on 04/25/2021 due to intermittent abdominal pain concern for gastroparesis which did not reveal any delayed gastric emptying issues.  Today, he feels tired. States he typically develops significant fatigue and weakness when his iron levels are low. He had to move out his appointment last month d/t a vacation. States he started feeling poorly about 2 months ago. Denies any bleeding.   REVIEW OF SYSTEMS:   Review of Systems  Constitutional:  Positive for malaise/fatigue.  Neurological:  Positive for weakness.   As per HPI. Otherwise, a complete review of systems is negative.  PAST MEDICAL HISTORY: Past Medical History:  Diagnosis Date   Esophageal stricture    Hyperlipidemia    Hypertension    Kidney stones     PAST SURGICAL HISTORY: Past Surgical History:  Procedure Laterality Date   COLONOSCOPY     COLONOSCOPY WITH PROPOFOL N/A 10/10/2015   Procedure: COLONOSCOPY WITH PROPOFOL;  Surgeon: Wallace Cullens, MD;  Location: [facility] ENDOSCOPY;  Service: Gastroenterology;  Laterality: N/A;   ESOPHAGOGASTRODUODENOSCOPY     ESOPHAGOGASTRODUODENOSCOPY (EGD) WITH PROPOFOL N/A 10/10/2015   Procedure: ESOPHAGOGASTRODUODENOSCOPY (EGD) WITH PROPOFOL;  Surgeon: Wallace Cullens, MD;  Location: [facility] ENDOSCOPY;  Service: Gastroenterology;  Laterality: N/A;    FAMILY HISTORY: Family History  Problem Relation Age of Onset   Hypertension Father      ADVANCED DIRECTIVES (Y/N):  N  HEALTH MAINTENANCE: Social History   Tobacco Use   Smoking status: Never   Smokeless tobacco: Never  Substance Use Topics   Alcohol use: Not Currently    Alcohol/week: 0.0 standard drinks   Drug use: Not Currently     Colonoscopy:  PAP:  Bone density:  Lipid panel:  Allergies  Allergen Reactions   Penicillins     Current Outpatient Medications  Medication Sig Dispense Refill   atorvastatin (LIPITOR) 10 MG tablet Take 10 mg by mouth daily.     fluticasone (FLONASE) 50 MCG/ACT nasal spray USE 2 SPRAYS IN EACH  NOSTRIL DAILY (Patient taking differently: Place 2 sprays into both nostrils daily.) 48 g 3   gemfibrozil (LOPID) 600 MG tablet Take 600 mg by mouth 2 (two) times daily before a meal.     lisinopril (PRINIVIL,ZESTRIL) 10 MG tablet Take 10 mg by mouth daily.     No current facility-administered medications for this visit.    OBJECTIVE: There were no vitals filed for this visit.    There is no height or weight on file to calculate BMI.    ECOG FS:0 - Asymptomatic  Physical Exam Constitutional:      Appearance: Normal appearance.  HENT:     Head: Normocephalic and atraumatic.  Eyes:     Pupils: Pupils are equal, round, and reactive to light.  Cardiovascular:     Rate and Rhythm: Normal rate and regular rhythm.     Heart sounds: Normal heart sounds. No murmur heard. Pulmonary:     Effort: Pulmonary effort is normal.  Breath sounds: Normal breath sounds. No wheezing.  Abdominal:     General: Bowel sounds are normal. There is no distension.     Palpations: Abdomen is soft.     Tenderness: There is no abdominal tenderness.  Musculoskeletal:        General: Normal range of motion.     Cervical back: Normal range of motion.  Skin:    General: Skin is warm and dry.     Findings: No rash.  Neurological:     Mental Status: He is alert and oriented to person, place, and time.  Psychiatric:        Judgment: Judgment normal.     LAB RESULTS:  Lab Results  Component Value Date   NA 138 12/18/2019   K 3.9 12/18/2019   CL 104 12/18/2019   CO2 28 12/18/2019   GLUCOSE 70 03/12/2021   BUN 17 12/18/2019   CREATININE 0.93 12/18/2019   CALCIUM 9.5 12/18/2019   PROT 7.9 12/18/2019   ALBUMIN 4.3 12/18/2019   AST 18 12/18/2019   ALT 18 12/18/2019   ALKPHOS 73 12/18/2019   BILITOT 0.7 12/18/2019   GFRNONAA >60 12/18/2019   GFRAA >60 12/18/2019    Lab Results  Component Value Date   WBC 9.5 05/23/2021   NEUTROABS 7.3 05/23/2021   HGB 12.5 (L) 05/23/2021   HCT 38.0 (L) 05/23/2021   MCV 87.4 05/23/2021   PLT 461 (H) 05/23/2021   Lab Results  Component Value Date   IRON 25 (L) 05/23/2021   TIBC 624 (H) 05/23/2021   IRONPCTSAT 4 (L) 05/23/2021   Lab Results  Component Value Date   FERRITIN 6 (L) 05/23/2021     STUDIES: No results found.  ASSESSMENT: Iron deficiency anemia.  PLAN:    1. Iron deficiency anemia:  Had colonoscopy and EGD in Nov 2020 with no evidence of bleeding.  Received intermittent iron-last on 10/02/20 and 10/09/20.  States he started having fatigue about 2 months ago.  Labs from today show ferritin of 6, iron saturation 4%, TIBC 624 and hemoglobin of 12.5.  Proceed with IV venofer X 5.  Restart iron supplements.  RTC in 3 months for labs with Dr. Laural Benes (cbc, ferritin, iron) and in 4 months for labs, md assessment and IV iron.   2.  Thrombocytosis:  Reactive.  Platelet count is 461,000 today.  This will improve with Iron.   I spent 20 minutes dedicated to the care of this patient (face-to-face and non-face-to-face) on the date of the encounter to include what is described in the assessment and plan.   Patient expressed understanding and was in agreement with this plan. He also understands that He can call clinic at any time with any questions, concerns, or complaints.     Mauro Kaufmann, NP   05/26/2021 1:08 PM

## 2021-05-26 NOTE — Progress Notes (Signed)
Patient has an increase fatigue.

## 2021-05-26 NOTE — Patient Instructions (Signed)
CANCER CENTER [facility] REGIONAL MEDICAL ONCOLOGY   Discharge Instructions: Thank you for choosing [facility] to provide your oncology and hematology care.  If you have a lab appointment with the Cancer Center, please go directly to the Cancer Center and check in at the registration area.  We strive to give you quality time with your provider. You may need to reschedule your appointment if you arrive late (15 or more minutes).  Arriving late affects you and other patients whose appointments are after yours.  Also, if you miss three or more appointments without notifying the office, you may be dismissed from the clinic at the provider's discretion.      For prescription refill requests, have your pharmacy contact our office and allow 72 hours for refills to be completed.    BELOW ARE SYMPTOMS THAT SHOULD BE REPORTED IMMEDIATELY: *FEVER GREATER THAN 100.4 F (38 C) OR HIGHER *CHILLS OR SWEATING *NAUSEA AND VOMITING THAT IS NOT CONTROLLED WITH YOUR NAUSEA MEDICATION *UNUSUAL SHORTNESS OF BREATH *UNUSUAL BRUISING OR BLEEDING *URINARY PROBLEMS (pain or burning when urinating, or frequent urination) *BOWEL PROBLEMS (unusual diarrhea, constipation, pain near the anus) TENDERNESS IN MOUTH AND THROAT WITH OR WITHOUT PRESENCE OF ULCERS (sore throat, sores in mouth, or a toothache) UNUSUAL RASH, SWELLING OR PAIN  UNUSUAL VAGINAL DISCHARGE OR ITCHING   Items with * indicate a potential emergency and should be followed up as soon as possible or go to the Emergency Department if any problems should occur.  Should you have questions after your visit or need to cancel or reschedule your appointment, please contact CANCER CENTER [facility] REGIONAL MEDICAL ONCOLOGY  [phone] and follow the prompts.  Office hours are 8:00 a.m. to 4:30 p.m. Monday - Friday. Please note that voicemails left after 4:00 p.m. may not be returned until the following business day.  We are closed weekends and major  holidays. You have access to a nurse at all times for urgent questions. Please call the main number to the clinic [phone] and follow the prompts.  For any non-urgent questions, you may also contact your provider using MyChart. We now offer e-Visits for anyone 65 and older to request care online for non-urgent symptoms. For details visit [email or web address].   Also download the MyChart app! Go to the app store, search "MyChart", open the app, select [place], and log in with your MyChart username and password.  Due to Covid, a mask is required upon entering the hospital/clinic. If you do not have a mask, one will be given to you upon arrival. For doctor visits, patients may have 1 support person aged 67 or older with them. For treatment visits, patients cannot have anyone with them due to current Covid guidelines and our immunocompromised population.   Iron Sucrose injection What is this medication? IRON SUCROSE (AHY ern SOO krohs) is an iron complex. Iron is used to make healthy red blood cells, which carry oxygen and nutrients throughout the body. This medicine is used to treat iron deficiency anemia in people with chronickidney disease. This medicine may be used for other purposes; ask your health care provider orpharmacist if you have questions. COMMON BRAND NAME(S): Venofer What should I tell my care team before I take this medication? They need to know if you have any of these conditions: anemia not caused by low iron levels heart disease high levels of iron in the blood kidney disease liver disease an unusual or allergic reaction to iron, other medicines, foods, dyes, or preservatives  pregnant or trying to get pregnant breast-feeding How should I use this medication? This medicine is for infusion into a vein. It is given by a health careprofessional in a hospital or clinic setting. Talk to your pediatrician regarding the use of this medicine in children. While this drug may  be prescribed for children as young as 2 years for selectedconditions, precautions do apply. Overdosage: If you think you have taken too much of this medicine contact apoison control center or emergency room at once. NOTE: This medicine is only for you. Do not share this medicine with others. What if I miss a dose? It is important not to miss your dose. Call your doctor or health careprofessional if you are unable to keep an appointment. What may interact with this medication? Do not take this medicine with any of the following medications: deferoxamine dimercaprol other iron products This medicine may also interact with the following medications: chloramphenicol deferasirox This list may not describe all possible interactions. Give your health care provider a list of all the medicines, herbs, non-prescription drugs, or dietary supplements you use. Also tell them if you smoke, drink alcohol, or use illegaldrugs. Some items may interact with your medicine. What should I watch for while using this medication? Visit your doctor or healthcare professional regularly. Tell your doctor or healthcare professional if your symptoms do not start to get better or if theyget worse. You may need blood work done while you are taking this medicine. You may need to follow a special diet. Talk to your doctor. Foods that contain iron include: whole grains/cereals, dried fruits, beans, or peas, leafy greenvegetables, and organ meats (liver, kidney). What side effects may I notice from receiving this medication? Side effects that you should report to your doctor or health care professionalas soon as possible: allergic reactions like skin rash, itching or hives, swelling of the face, lips, or tongue breathing problems changes in blood pressure cough fast, irregular heartbeat feeling faint or lightheaded, falls fever or chills flushing, sweating, or hot feelings joint or muscle aches/pains seizures swelling of  the ankles or feet unusually weak or tired Side effects that usually do not require medical attention (report to yourdoctor or health care professional if they continue or are bothersome): diarrhea feeling achy headache irritation at site where injected nausea, vomiting stomach upset tiredness This list may not describe all possible side effects. Call your doctor for medical advice about side effects. You may report side effects to FDA at1-800-FDA-1088. Where should I keep my medication? This drug is given in a hospital or clinic and will not be stored at home. NOTE: This sheet is a summary. It may not cover all possible information. If you have questions about this medicine, talk to your doctor, pharmacist, orhealth care provider.  2022 Elsevier/Gold Standard (2011-07-23 17:14:35)

## 2021-05-26 NOTE — Progress Notes (Signed)
Pt received IV venofer infusion in clinic today. VSS @ d/c. °

## 2021-05-28 ENCOUNTER — Inpatient Hospital Stay: Payer: Managed Care, Other (non HMO)

## 2021-05-28 VITALS — BP 126/75 | HR 75 | Resp 18

## 2021-05-28 DIAGNOSIS — D509 Iron deficiency anemia, unspecified: Secondary | ICD-10-CM

## 2021-05-28 DIAGNOSIS — D508 Other iron deficiency anemias: Secondary | ICD-10-CM | POA: Diagnosis not present

## 2021-05-28 MED ORDER — IRON SUCROSE 20 MG/ML IV SOLN
200.0000 mg | Freq: Once | INTRAVENOUS | Status: AC
  Administered 2021-05-28: 200 mg via INTRAVENOUS
  Filled 2021-05-28: qty 10

## 2021-05-28 MED ORDER — SODIUM CHLORIDE 0.9 % IV SOLN
Freq: Once | INTRAVENOUS | Status: AC
  Filled 2021-05-28: qty 250

## 2021-05-28 MED ORDER — SODIUM CHLORIDE 0.9 % IV SOLN: 200.0000 mg | Freq: Once | INTRAVENOUS | Status: DC

## 2021-05-28 NOTE — Patient Instructions (Signed)
CANCER CENTER [place] REGIONAL MEDICAL ONCOLOGY  Discharge Instructions: Thank you for choosing [facility] to provide your oncology and hematology care.  If you have a lab appointment with the Cancer Center, please go directly to the Cancer Center and check in at the registration area.  Wear comfortable clothing and clothing appropriate for easy access to any Portacath or PICC line.   We strive to give you quality time with your provider. You may need to reschedule your appointment if you arrive late (15 or more minutes).  Arriving late affects you and other patients whose appointments are after yours.  Also, if you miss three or more appointments without notifying the office, you may be dismissed from the clinic at the provider's discretion.      For prescription refill requests, have your pharmacy contact our office and allow 72 hours for refills to be completed.    Today you received the following : Venofer   To help prevent nausea and vomiting after your treatment, we encourage you to take your nausea medication as directed.  BELOW ARE SYMPTOMS THAT SHOULD BE REPORTED IMMEDIATELY: . *FEVER GREATER THAN 100.4 F (38 C) OR HIGHER . *CHILLS OR SWEATING . *NAUSEA AND VOMITING THAT IS NOT CONTROLLED WITH YOUR NAUSEA MEDICATION . *UNUSUAL SHORTNESS OF BREATH . *UNUSUAL BRUISING OR BLEEDING . *URINARY PROBLEMS (pain or burning when urinating, or frequent urination) . *BOWEL PROBLEMS (unusual diarrhea, constipation, pain near the anus) . TENDERNESS IN MOUTH AND THROAT WITH OR WITHOUT PRESENCE OF ULCERS (sore throat, sores in mouth, or a toothache) . UNUSUAL RASH, SWELLING OR PAIN  . UNUSUAL VAGINAL DISCHARGE OR ITCHING   Items with * indicate a potential emergency and should be followed up as soon as possible or go to the Emergency Department if any problems should occur.  Please show the CHEMOTHERAPY ALERT CARD or IMMUNOTHERAPY ALERT CARD at check-in to the Emergency  Department and triage nurse.  Should you have questions after your visit or need to cancel or reschedule your appointment, please contact CANCER CENTER [place] REGIONAL MEDICAL ONCOLOGY  [phone] and follow the prompts.  Office hours are 8:00 a.m. to 4:30 p.m. Monday - Friday. Please note that voicemails left after 4:00 p.m. may not be returned until the following business day.  We are closed weekends and major holidays. You have access to a nurse at all times for urgent questions. Please call the main number to the clinic [phone] and follow the prompts.  For any non-urgent questions, you may also contact your provider using MyChart. We now offer e-Visits for anyone 18 and older to request care online for non-urgent symptoms. For details visit [email or web address].   Also download the MyChart app! Go to the app store, search "MyChart", open the app, select [place], and log in with your MyChart username and password.  Due to Covid, a mask is required upon entering the hospital/clinic. If you do not have a mask, one will be given to you upon arrival. For doctor visits, patients may have 1 support person aged 18 or older with them. For treatment visits, patients cannot have anyone with them due to current Covid guidelines and our immunocompromised population.  

## 2021-05-30 ENCOUNTER — Ambulatory Visit: Payer: Managed Care, Other (non HMO)

## 2021-06-02 ENCOUNTER — Inpatient Hospital Stay: Payer: Managed Care, Other (non HMO)

## 2021-06-02 VITALS — BP 146/82 | HR 84 | Temp 96.9°F | Resp 18

## 2021-06-02 DIAGNOSIS — D508 Other iron deficiency anemias: Secondary | ICD-10-CM | POA: Diagnosis not present

## 2021-06-02 DIAGNOSIS — D509 Iron deficiency anemia, unspecified: Secondary | ICD-10-CM

## 2021-06-02 MED ORDER — IRON SUCROSE 20 MG/ML IV SOLN
200.0000 mg | Freq: Once | INTRAVENOUS | Status: AC
  Administered 2021-06-02: 200 mg via INTRAVENOUS
  Filled 2021-06-02: qty 10

## 2021-06-02 MED ORDER — SODIUM CHLORIDE 0.9 % IV SOLN: 200.0000 mg | Freq: Once | INTRAVENOUS | Status: DC

## 2021-06-02 MED ORDER — SODIUM CHLORIDE 0.9 % IV SOLN
Freq: Once | INTRAVENOUS | Status: AC
  Filled 2021-06-02: qty 250

## 2021-06-02 NOTE — Patient Instructions (Signed)

## 2021-06-04 ENCOUNTER — Ambulatory Visit: Payer: Managed Care, Other (non HMO)

## 2021-06-04 ENCOUNTER — Inpatient Hospital Stay: Payer: Managed Care, Other (non HMO)

## 2021-06-04 ENCOUNTER — Other Ambulatory Visit: Payer: Self-pay

## 2021-06-04 VITALS — BP 117/77 | HR 84 | Temp 97.5°F | Resp 20

## 2021-06-04 DIAGNOSIS — D508 Other iron deficiency anemias: Secondary | ICD-10-CM | POA: Diagnosis not present

## 2021-06-04 DIAGNOSIS — D509 Iron deficiency anemia, unspecified: Secondary | ICD-10-CM

## 2021-06-04 MED ORDER — SODIUM CHLORIDE 0.9 % IV SOLN
Freq: Once | INTRAVENOUS | Status: AC
  Filled 2021-06-04: qty 250

## 2021-06-04 MED ORDER — IRON SUCROSE 20 MG/ML IV SOLN
200.0000 mg | Freq: Once | INTRAVENOUS | Status: AC
  Administered 2021-06-04: 200 mg via INTRAVENOUS
  Filled 2021-06-04: qty 10

## 2021-06-04 MED ORDER — SODIUM CHLORIDE 0.9 % IV SOLN: 200.0000 mg | Freq: Once | INTRAVENOUS | Status: DC

## 2021-06-04 NOTE — Patient Instructions (Signed)
[facility] [place] [facility]  Discharge Instructions: Thank you for choosing [facility] to provide your oncology and hematology care.  If you have a lab appointment with the Cancer Center, please go directly to the Cancer Center and check in at the registration area.  Wear comfortable clothing and clothing appropriate for easy access to any Portacath or PICC line.   We strive to give you quality time with your provider. You may need to reschedule your appointment if you arrive late (15 or more minutes).  Arriving late affects you and other patients whose appointments are after yours.  Also, if you miss three or more appointments without notifying the office, you may be dismissed from the clinic at the provider's discretion.      For prescription refill requests, have your pharmacy contact our office and allow 72 hours for refills to be completed.    Today you received the following chemotherapy and/or immunotherapy agents VENOFER      To help prevent nausea and vomiting after your treatment, we encourage you to take your nausea medication as directed.  BELOW ARE SYMPTOMS THAT SHOULD BE REPORTED IMMEDIATELY: *FEVER GREATER THAN 100.4 F (38 C) OR HIGHER *CHILLS OR SWEATING *NAUSEA AND VOMITING THAT IS NOT CONTROLLED WITH YOUR NAUSEA MEDICATION *UNUSUAL SHORTNESS OF BREATH *UNUSUAL BRUISING OR BLEEDING *URINARY PROBLEMS (pain or burning when urinating, or frequent urination) *BOWEL PROBLEMS (unusual diarrhea, constipation, pain near the anus) TENDERNESS IN MOUTH AND THROAT WITH OR WITHOUT PRESENCE OF ULCERS (sore throat, sores in mouth, or a toothache) UNUSUAL RASH, SWELLING OR PAIN  UNUSUAL VAGINAL DISCHARGE OR ITCHING   Items with * indicate a potential emergency and should be followed up as soon as possible or go to the Emergency Department if any problems should occur.  Please show the CHEMOTHERAPY ALERT CARD or IMMUNOTHERAPY ALERT CARD at check-in to  the Emergency Department and triage nurse.  Should you have questions after your visit or need to cancel or reschedule your appointment, please contact [facility] [place] [facility]  [phone] and follow the prompts.  Office hours are 8:00 a.m. to 4:30 p.m. Monday - Friday. Please note that voicemails left after 4:00 p.m. may not be returned until the following business day.  We are closed weekends and major holidays. You have access to a nurse at all times for urgent questions. Please call the main number to the clinic [phone] and follow the prompts.  For any non-urgent questions, you may also contact your provider using MyChart. We now offer e-Visits for anyone 18 and older to request care online for non-urgent symptoms. For details visit [email or web address].   Also download the MyChart app! Go to the app store, search "MyChart", open the app, select [place], and log in with your MyChart username and password.  Due to Covid, a mask is required upon entering the hospital/clinic. If you do not have a mask, one will be given to you upon arrival. For doctor visits, patients may have 1 support person aged 18 or older with them. For treatment visits, patients cannot have anyone with them due to current Covid guidelines and our immunocompromised population.   Iron Sucrose injection What is this medication? IRON SUCROSE (AHY ern SOO krohs) is an iron complex. Iron is used to make healthy red blood cells, which carry oxygen and nutrients throughout the body. This medicine is used to treat iron deficiency anemia in people with chronickidney disease. This medicine may be used for other   purposes; ask your health care provider orpharmacist if you have questions. COMMON BRAND NAME(S): Venofer What should I tell my care team before I take this medication? They need to know if you have any of these conditions: anemia not caused by low iron levels heart disease high levels of  iron in the blood kidney disease liver disease an unusual or allergic reaction to iron, other medicines, foods, dyes, or preservatives pregnant or trying to get pregnant breast-feeding How should I use this medication? This medicine is for infusion into a vein. It is given by a health careprofessional in a hospital or clinic setting. Talk to your pediatrician regarding the use of this medicine in children. While this drug may be prescribed for children as young as 2 years for selectedconditions, precautions do apply. Overdosage: If you think you have taken too much of this medicine contact apoison control center or emergency room at once. NOTE: This medicine is only for you. Do not share this medicine with others. What if I miss a dose? It is important not to miss your dose. Call your doctor or health careprofessional if you are unable to keep an appointment. What may interact with this medication? Do not take this medicine with any of the following medications: deferoxamine dimercaprol other iron products This medicine may also interact with the following medications: chloramphenicol deferasirox This list may not describe all possible interactions. Give your health care provider a list of all the medicines, herbs, non-prescription drugs, or dietary supplements you use. Also tell them if you smoke, drink alcohol, or use illegaldrugs. Some items may interact with your medicine. What should I watch for while using this medication? Visit your doctor or healthcare professional regularly. Tell your doctor or healthcare professional if your symptoms do not start to get better or if theyget worse. You may need blood work done while you are taking this medicine. You may need to follow a special diet. Talk to your doctor. Foods that contain iron include: whole grains/cereals, dried fruits, beans, or peas, leafy greenvegetables, and organ meats (liver, kidney). What side effects may I notice from  receiving this medication? Side effects that you should report to your doctor or health care professionalas soon as possible: allergic reactions like skin rash, itching or hives, swelling of the face, lips, or tongue breathing problems changes in blood pressure cough fast, irregular heartbeat feeling faint or lightheaded, falls fever or chills flushing, sweating, or hot feelings joint or muscle aches/pains seizures swelling of the ankles or feet unusually weak or tired Side effects that usually do not require medical attention (report to yourdoctor or health care professional if they continue or are bothersome): diarrhea feeling achy headache irritation at site where injected nausea, vomiting stomach upset tiredness This list may not describe all possible side effects. Call your doctor for medical advice about side effects. You may report side effects to FDA at1-800-FDA-1088. Where should I keep my medication? This drug is given in a hospital or clinic and will not be stored at home. NOTE: This sheet is a summary. It may not cover all possible information. If you have questions about this medicine, talk to your doctor, pharmacist, orhealth care provider.  2022 Elsevier/Gold Standard (2011-07-23 17:14:35)  

## 2021-06-06 ENCOUNTER — Other Ambulatory Visit: Payer: Self-pay

## 2021-06-06 ENCOUNTER — Inpatient Hospital Stay: Payer: Managed Care, Other (non HMO)

## 2021-06-06 VITALS — BP 127/75 | HR 82 | Temp 96.2°F | Resp 18

## 2021-06-06 DIAGNOSIS — D509 Iron deficiency anemia, unspecified: Secondary | ICD-10-CM

## 2021-06-06 DIAGNOSIS — D508 Other iron deficiency anemias: Secondary | ICD-10-CM | POA: Diagnosis not present

## 2021-06-06 MED ORDER — IRON SUCROSE 20 MG/ML IV SOLN
200.0000 mg | Freq: Once | INTRAVENOUS | Status: AC
  Administered 2021-06-06: 200 mg via INTRAVENOUS
  Filled 2021-06-06: qty 10

## 2021-06-06 MED ORDER — SODIUM CHLORIDE 0.9 % IV SOLN: 200.0000 mg | Freq: Once | INTRAVENOUS | Status: DC

## 2021-06-06 MED ORDER — SODIUM CHLORIDE 0.9 % IV SOLN
Freq: Once | INTRAVENOUS | Status: AC
  Filled 2021-06-06: qty 250

## 2021-06-06 NOTE — Patient Instructions (Signed)
[facility] [place] [facility]  Discharge Instructions: Thank you for choosing [facility] to provide your oncology and hematology care.  If you have a lab appointment with the Cancer Center, please go directly to the Cancer Center and check in at the registration area.  Wear comfortable clothing and clothing appropriate for easy access to any Portacath or PICC line.   We strive to give you quality time with your provider. You may need to reschedule your appointment if you arrive late (15 or more minutes).  Arriving late affects you and other patients whose appointments are after yours.  Also, if you miss three or more appointments without notifying the office, you may be dismissed from the clinic at the provider's discretion.      For prescription refill requests, have your pharmacy contact our office and allow 72 hours for refills to be completed.    Today you received the following chemotherapy and/or immunotherapy agents VENOFER      To help prevent nausea and vomiting after your treatment, we encourage you to take your nausea medication as directed.  BELOW ARE SYMPTOMS THAT SHOULD BE REPORTED IMMEDIATELY: *FEVER GREATER THAN 100.4 F (38 C) OR HIGHER *CHILLS OR SWEATING *NAUSEA AND VOMITING THAT IS NOT CONTROLLED WITH YOUR NAUSEA MEDICATION *UNUSUAL SHORTNESS OF BREATH *UNUSUAL BRUISING OR BLEEDING *URINARY PROBLEMS (pain or burning when urinating, or frequent urination) *BOWEL PROBLEMS (unusual diarrhea, constipation, pain near the anus) TENDERNESS IN MOUTH AND THROAT WITH OR WITHOUT PRESENCE OF ULCERS (sore throat, sores in mouth, or a toothache) UNUSUAL RASH, SWELLING OR PAIN  UNUSUAL VAGINAL DISCHARGE OR ITCHING   Items with * indicate a potential emergency and should be followed up as soon as possible or go to the Emergency Department if any problems should occur.  Please show the CHEMOTHERAPY ALERT CARD or IMMUNOTHERAPY ALERT CARD at check-in to  the Emergency Department and triage nurse.  Should you have questions after your visit or need to cancel or reschedule your appointment, please contact [facility]  [phone] and follow the prompts.  Office hours are 8:00 a.m. to 4:30 p.m. Monday - Friday. Please note that voicemails left after 4:00 p.m. may not be returned until the following business day.  We are closed weekends and major holidays. You have access to a nurse at all times for urgent questions. Please call the main number to the clinic [phone] and follow the prompts.  For any non-urgent questions, you may also contact your provider using MyChart. We now offer e-Visits for anyone 18 and older to request care online for non-urgent symptoms. For details visit [email or web address].   Also download the MyChart app! Go to the app store, search "MyChart", open the app, select [place], and log in with your MyChart username and password.  Due to Covid, a mask is required upon entering the hospital/clinic. If you do not have a mask, one will be given to you upon arrival. For doctor visits, patients may have 1 support person aged 18 or older with them. For treatment visits, patients cannot have anyone with them due to current Covid guidelines and our immunocompromised population.   Iron Sucrose injection What is this medication? IRON SUCROSE (AHY ern SOO krohs) is an iron complex. Iron is used to make healthy red blood cells, which carry oxygen and nutrients throughout the body. This medicine is used to treat iron deficiency anemia in people with chronickidney disease. This medicine may be used for other   purposes; ask your health care provider orpharmacist if you have questions. COMMON BRAND NAME(S): Venofer What should I tell my care team before I take this medication? They need to know if you have any of these conditions: anemia not caused by low iron levels heart disease high levels of  iron in the blood kidney disease liver disease an unusual or allergic reaction to iron, other medicines, foods, dyes, or preservatives pregnant or trying to get pregnant breast-feeding How should I use this medication? This medicine is for infusion into a vein. It is given by a health careprofessional in a hospital or clinic setting. Talk to your pediatrician regarding the use of this medicine in children. While this drug may be prescribed for children as young as 2 years for selectedconditions, precautions do apply. Overdosage: If you think you have taken too much of this medicine contact apoison control center or emergency room at once. NOTE: This medicine is only for you. Do not share this medicine with others. What if I miss a dose? It is important not to miss your dose. Call your doctor or health careprofessional if you are unable to keep an appointment. What may interact with this medication? Do not take this medicine with any of the following medications: deferoxamine dimercaprol other iron products This medicine may also interact with the following medications: chloramphenicol deferasirox This list may not describe all possible interactions. Give your health care provider a list of all the medicines, herbs, non-prescription drugs, or dietary supplements you use. Also tell them if you smoke, drink alcohol, or use illegaldrugs. Some items may interact with your medicine. What should I watch for while using this medication? Visit your doctor or healthcare professional regularly. Tell your doctor or healthcare professional if your symptoms do not start to get better or if theyget worse. You may need blood work done while you are taking this medicine. You may need to follow a special diet. Talk to your doctor. Foods that contain iron include: whole grains/cereals, dried fruits, beans, or peas, leafy greenvegetables, and organ meats (liver, kidney). What side effects may I notice from  receiving this medication? Side effects that you should report to your doctor or health care professionalas soon as possible: allergic reactions like skin rash, itching or hives, swelling of the face, lips, or tongue breathing problems changes in blood pressure cough fast, irregular heartbeat feeling faint or lightheaded, falls fever or chills flushing, sweating, or hot feelings joint or muscle aches/pains seizures swelling of the ankles or feet unusually weak or tired Side effects that usually do not require medical attention (report to yourdoctor or health care professional if they continue or are bothersome): diarrhea feeling achy headache irritation at site where injected nausea, vomiting stomach upset tiredness This list may not describe all possible side effects. Call your doctor for medical advice about side effects. You may report side effects to FDA at1-800-FDA-1088. Where should I keep my medication? This drug is given in a hospital or clinic and will not be stored at home. NOTE: This sheet is a summary. It may not cover all possible information. If you have questions about this medicine, talk to your doctor, pharmacist, orhealth care provider.  2022 Elsevier/Gold Standard (2011-07-23 17:14:35)  

## 2021-06-24 ENCOUNTER — Ambulatory Visit: Payer: Managed Care, Other (non HMO) | Admitting: Podiatry

## 2021-06-24 ENCOUNTER — Encounter: Payer: Self-pay | Admitting: Podiatry

## 2021-06-24 ENCOUNTER — Other Ambulatory Visit: Payer: Self-pay

## 2021-06-24 DIAGNOSIS — M722 Plantar fascial fibromatosis: Secondary | ICD-10-CM | POA: Diagnosis not present

## 2021-06-24 NOTE — Progress Notes (Signed)
  Subjective:  Patient ID: Ryan Bonilla, male    DOB: 1955-11-04,  MRN: [id number]  Chief Complaint  Patient presents with   Plantar Fasciitis    Pt stated that he is doing well he stated that he still has some pain here and there but not as bad as it was     65 y.o. male presents with the above complaint.  Patient presents for follow-up on left Planter fasciitis.  Patient states that he is doing a lot better after the last steroid injection.  The injection and bracing both helped.  He now has tiny amount of residual pain once in a while.  He denies any other acute complaints he is still awaiting orthotics   Review of Systems: Negative except as noted in the HPI. Denies N/V/F/Ch.  Past Medical History:  Diagnosis Date   Esophageal stricture    Hyperlipidemia    Hypertension    Kidney stones     Current Outpatient Medications:    atorvastatin (LIPITOR) 10 MG tablet, Take 10 mg by mouth daily., Disp: , Rfl:    fluticasone (FLONASE) 50 MCG/ACT nasal spray, USE 2 SPRAYS IN EACH  NOSTRIL DAILY (Patient taking differently: Place 2 sprays into both nostrils daily.), Disp: 48 g, Rfl: 3   gemfibrozil (LOPID) 600 MG tablet, Take 600 mg by mouth 2 (two) times daily before a meal., Disp: , Rfl:    lisinopril (PRINIVIL,ZESTRIL) 10 MG tablet, Take 10 mg by mouth daily., Disp: , Rfl:   Social History   Tobacco Use  Smoking Status Never  Smokeless Tobacco Never    Allergies  Allergen Reactions   Penicillins    Objective:  There were no vitals filed for this visit. There is no height or weight on file to calculate BMI. Constitutional Well developed. Well nourished.  Vascular Dorsalis pedis pulses palpable bilaterally. Posterior tibial pulses palpable bilaterally. Capillary refill normal to all digits.  No cyanosis or clubbing noted. Pedal hair growth normal.  Neurologic Normal speech. Oriented to person, place, and time. Epicritic sensation to light touch grossly present  bilaterally.  Dermatologic Nails well groomed and normal in appearance. No open wounds. No skin lesions.  Orthopedic: Normal joint ROM without pain or crepitus bilaterally. No visible deformities. No further tender to palpation at the calcaneal tuber left. No pain with calcaneal squeeze left. Ankle ROM diminished range of motion left. Silfverskiold Test: negative left.   Radiographs: Taken and reviewed. No acute fractures or dislocations. No evidence of stress fracture.  Plantar heel spur absent. Posterior heel spur absent.   Assessment:   1. Plantar fasciitis of left foot      Plan:  Patient was evaluated and treated and all questions answered.  Plantar Fasciitis, left -Clinically healed.  I discussed shoe gear mild modification as well as orthotics for further management.  At this time I will hold off on any further steroid injection.  Patient states understanding.   Pes planovalgus/foot deformity -I explained to the patient the etiology of pes planovalgus and various treatment options were extensively discussed.  Given the type of pain that is having from Planter fasciitis as well as the underlying foot deformity I believe patient will benefit from custom-made orthotics to help control the hindfoot motion support the arch of the foot take the stress away from plantar fascial.  Patient states understanding. -Awaiting orthotics   No follow-ups on file.

## 2021-07-01 ENCOUNTER — Ambulatory Visit (INDEPENDENT_AMBULATORY_CARE_PROVIDER_SITE_OTHER): Payer: Managed Care, Other (non HMO) | Admitting: Podiatry

## 2021-07-01 ENCOUNTER — Other Ambulatory Visit: Payer: Self-pay

## 2021-07-01 DIAGNOSIS — Q666 Other congenital valgus deformities of feet: Secondary | ICD-10-CM

## 2021-07-01 DIAGNOSIS — M722 Plantar fascial fibromatosis: Secondary | ICD-10-CM

## 2021-07-01 NOTE — Progress Notes (Signed)
  Subjective:  Patient ID: Ryan Bonilla, male    DOB: [birth year],  MRN: [id number]  Chief Complaint  Patient presents with   Foot Orthotics    PT stated that he is here to pick up orthotics     65 y.o. male presents with the above complaint.  Patient presents for follow-up of left Planter fasciitis.  He still continues to do better.  He is here to pick up his orthotics.  He denies any other acute complaints.  Review of Systems: Negative except as noted in the HPI. Denies N/V/F/Ch.  Past Medical History:  Diagnosis Date   Esophageal stricture    Hyperlipidemia    Hypertension    Kidney stones     Current Outpatient Medications:    atorvastatin (LIPITOR) 10 MG tablet, Take 10 mg by mouth daily., Disp: , Rfl:    fluticasone (FLONASE) 50 MCG/ACT nasal spray, USE 2 SPRAYS IN EACH  NOSTRIL DAILY (Patient taking differently: Place 2 sprays into both nostrils daily.), Disp: 48 g, Rfl: 3   gemfibrozil (LOPID) 600 MG tablet, Take 600 mg by mouth 2 (two) times daily before a meal., Disp: , Rfl:    lisinopril (PRINIVIL,ZESTRIL) 10 MG tablet, Take 10 mg by mouth daily., Disp: , Rfl:   Social History   Tobacco Use  Smoking Status Never  Smokeless Tobacco Never    Allergies  Allergen Reactions   Penicillins    Objective:  There were no vitals filed for this visit. There is no height or weight on file to calculate BMI. Constitutional Well developed. Well nourished.  Vascular Dorsalis pedis pulses palpable bilaterally. Posterior tibial pulses palpable bilaterally. Capillary refill normal to all digits.  No cyanosis or clubbing noted. Pedal hair growth normal.  Neurologic Normal speech. Oriented to person, place, and time. Epicritic sensation to light touch grossly present bilaterally.  Dermatologic Nails well groomed and normal in appearance. No open wounds. No skin lesions.  Orthopedic: Normal joint ROM without pain or crepitus bilaterally. No visible deformities. No further  tender to palpation at the calcaneal tuber left. No pain with calcaneal squeeze left. Ankle ROM diminished range of motion left. Silfverskiold Test: negative left.   Radiographs: Taken and reviewed. No acute fractures or dislocations. No evidence of stress fracture.  Plantar heel spur absent. Posterior heel spur absent.   Assessment:   No diagnosis found.    Plan:  Patient was evaluated and treated and all questions answered.  Plantar Fasciitis, left -Clinically healed.  I discussed shoe gear mild modification as well as orthotics for further management.  At this time I will hold off on any further steroid injection.  Patient states understanding.   Pes planovalgus/foot deformity -I explained to the patient the etiology of pes planovalgus and various treatment options were extensively discussed.  Given the type of pain that is having from Planter fasciitis as well as the underlying foot deformity I believe patient will benefit from custom-made orthotics to help control the hindfoot motion support the arch of the foot take the stress away from plantar fascial.  Patient states understanding. -Orthotics were dispensed.  They are fitting well.  Patient is functioning well in them.   No follow-ups on file.

## 2021-08-06 ENCOUNTER — Other Ambulatory Visit: Payer: Managed Care, Other (non HMO)

## 2021-09-11 ENCOUNTER — Telehealth: Payer: Self-pay | Admitting: Oncology

## 2021-09-11 NOTE — Telephone Encounter (Signed)
Pt would like for someone to call him about his lab work appt. Please give him a call back at [phone]

## 2021-09-12 ENCOUNTER — Other Ambulatory Visit: Payer: Self-pay

## 2021-09-12 ENCOUNTER — Inpatient Hospital Stay: Payer: Managed Care, Other (non HMO) | Attending: Oncology

## 2021-09-12 DIAGNOSIS — D509 Iron deficiency anemia, unspecified: Secondary | ICD-10-CM | POA: Insufficient documentation

## 2021-09-12 LAB — CBC WITH DIFFERENTIAL/PLATELET
Abs Immature Granulocytes: 0.03 10*3/uL (ref 0.00–0.07)
Basophils Absolute: 0 10*3/uL (ref 0.0–0.1)
Basophils Relative: 1 %
Eosinophils Absolute: 0.2 10*3/uL (ref 0.0–0.5)
Eosinophils Relative: 3 %
HCT: 40 % (ref 39.0–52.0)
Hemoglobin: 13.8 g/dL (ref 13.0–17.0)
Immature Granulocytes: 0 %
Lymphocytes Relative: 23 %
Lymphs Abs: 1.6 10*3/uL (ref 0.7–4.0)
MCH: 30.4 pg (ref 26.0–34.0)
MCHC: 34.5 g/dL (ref 30.0–36.0)
MCV: 88.1 fL (ref 80.0–100.0)
Monocytes Absolute: 0.5 10*3/uL (ref 0.1–1.0)
Monocytes Relative: 7 %
Neutro Abs: 4.6 10*3/uL (ref 1.7–7.7)
Neutrophils Relative %: 66 %
Platelets: 383 10*3/uL (ref 150–400)
RBC: 4.54 MIL/uL (ref 4.22–5.81)
RDW: 13.7 % (ref 11.5–15.5)
WBC: 7.1 10*3/uL (ref 4.0–10.5)
nRBC: 0 % (ref 0.0–0.2)

## 2021-09-12 LAB — IRON AND TIBC
Iron: 121 ug/dL (ref 45–182)
Saturation Ratios: 24 % (ref 17.9–39.5)
TIBC: 496 ug/dL — ABNORMAL HIGH (ref 250–450)
UIBC: 375 ug/dL

## 2021-09-12 LAB — FERRITIN: Ferritin: 31 ng/mL (ref 24–336)

## 2021-09-22 NOTE — Progress Notes (Signed)
[facility]  Telephone:(336) [phone] [phone]  ID: Ryan Bonilla OB: 1956/10/08  MR#: [id number]  NLZ#:[id number]  Patient Care Team: Gracelyn Nurse, MD as PCP - General (Internal Medicine) Jeralyn Ruths, MD as Consulting Physician (Hematology and Oncology)  CHIEF COMPLAINT: Iron deficiency anemia.  INTERVAL HISTORY: Patient returns to clinic today for repeat laboratory work, further evaluation, and consideration of additional IV Venofer.  He currently feels well and is asymptomatic.  He continues to be active and work full-time.  He does not complain of any weakness or fatigue today. He has no neurologic complaints.  He denies any recent fevers or illnesses.  He has a good appetite and denies weight loss.  He has no chest pain, shortness of breath, cough, or hemoptysis.  He denies any nausea, vomiting, constipation, or diarrhea.  He denies any melena or hematochezia.  He has no urinary complaints.  Patient offers no specific complaints today.  REVIEW OF SYSTEMS:   Review of Systems  Constitutional: Negative.  Negative for fever, malaise/fatigue and weight loss.  Respiratory: Negative.  Negative for cough, hemoptysis and shortness of breath.   Cardiovascular: Negative.  Negative for chest pain and leg swelling.  Gastrointestinal: Negative.  Negative for abdominal pain, blood in stool and melena.  Genitourinary: Negative.  Negative for hematuria.  Musculoskeletal: Negative.  Negative for back pain.  Skin: Negative.  Negative for rash.  Neurological: Negative.  Negative for dizziness, focal weakness, weakness and headaches.  Psychiatric/Behavioral: Negative.  The patient is not nervous/anxious.    As per HPI. Otherwise, a complete review of systems is negative.  PAST MEDICAL HISTORY: Past Medical History:  Diagnosis Date   Esophageal stricture    Hyperlipidemia    Hypertension    Kidney stones     PAST SURGICAL HISTORY: Past Surgical History:   Procedure Laterality Date   COLONOSCOPY     COLONOSCOPY WITH PROPOFOL N/A 10/10/2015   Procedure: COLONOSCOPY WITH PROPOFOL;  Surgeon: Wallace Cullens, MD;  Location: [facility] ENDOSCOPY;  Service: Gastroenterology;  Laterality: N/A;   ESOPHAGOGASTRODUODENOSCOPY     ESOPHAGOGASTRODUODENOSCOPY (EGD) WITH PROPOFOL N/A 10/10/2015   Procedure: ESOPHAGOGASTRODUODENOSCOPY (EGD) WITH PROPOFOL;  Surgeon: Wallace Cullens, MD;  Location: [facility] ENDOSCOPY;  Service: Gastroenterology;  Laterality: N/A;    FAMILY HISTORY: Family History  Problem Relation Age of Onset   Hypertension Father     ADVANCED DIRECTIVES (Y/N):  N  HEALTH MAINTENANCE: Social History   Tobacco Use   Smoking status: Never   Smokeless tobacco: Never  Substance Use Topics   Alcohol use: Not Currently    Alcohol/week: 0.0 standard drinks   Drug use: Not Currently     Colonoscopy:  PAP:  Bone density:  Lipid panel:  Allergies  Allergen Reactions   Penicillins     Current Outpatient Medications  Medication Sig Dispense Refill   atorvastatin (LIPITOR) 10 MG tablet Take 10 mg by mouth daily.     fluticasone (FLONASE) 50 MCG/ACT nasal spray USE 2 SPRAYS IN EACH  NOSTRIL DAILY (Patient taking differently: Place 2 sprays into both nostrils daily.) 48 g 3   gemfibrozil (LOPID) 600 MG tablet Take 600 mg by mouth 2 (two) times daily before a meal.     lisinopril (PRINIVIL,ZESTRIL) 10 MG tablet Take 10 mg by mouth daily.     No current facility-administered medications for this visit.    OBJECTIVE: Vitals:   09/25/21 1301  BP: (!) 141/74  Pulse: 97  Resp: 16  Temp: (!)  97.3 F (36.3 C)  SpO2: 100%     Body mass index is 23.09 kg/m.    ECOG FS:0 - Asymptomatic  General: Well-developed, well-nourished, no acute distress. Eyes: Pink conjunctiva, anicteric sclera. HEENT: Normocephalic, moist mucous membranes. Lungs: No audible wheezing or coughing. Heart: Regular rate and rhythm. Abdomen: Soft, nontender, no obvious  distention. Musculoskeletal: No edema, cyanosis, or clubbing. Neuro: Alert, answering all questions appropriately. Cranial nerves grossly intact. Skin: No rashes or petechiae noted. Psych: Normal affect.  LAB RESULTS:  Lab Results  Component Value Date   NA 138 12/18/2019   K 3.9 12/18/2019   CL 104 12/18/2019   CO2 28 12/18/2019   GLUCOSE 70 03/12/2021   BUN 17 12/18/2019   CREATININE 0.93 12/18/2019   CALCIUM 9.5 12/18/2019   PROT 7.9 12/18/2019   ALBUMIN 4.3 12/18/2019   AST 18 12/18/2019   ALT 18 12/18/2019   ALKPHOS 73 12/18/2019   BILITOT 0.7 12/18/2019   GFRNONAA >60 12/18/2019   GFRAA >60 12/18/2019    Lab Results  Component Value Date   WBC 7.1 09/12/2021   NEUTROABS 4.6 09/12/2021   HGB 13.8 09/12/2021   HCT 40.0 09/12/2021   MCV 88.1 09/12/2021   PLT 383 09/12/2021   Lab Results  Component Value Date   IRON 121 09/12/2021   TIBC 496 (H) 09/12/2021   IRONPCTSAT 24 09/12/2021   Lab Results  Component Value Date   FERRITIN 31 09/12/2021     STUDIES: No results found.  ASSESSMENT: Iron deficiency anemia.  PLAN:    1. Iron deficiency anemia: Patient's hemoglobin and iron stores are now within normal limits.  Colonoscopy and EGD in November 2021 were reported as normal with no significant pathology.  Patient was noted to have mild gastritis.  No intervention is needed at this time.  Patient last received IV Venofer on June 06, 2021.  Return to clinic in 4 months with repeat laboratory work, further evaluation, and consideration of additional treatment if needed.   2.  Thrombocytosis: Resolved.  I spent a total of 20 minutes reviewing chart data, face-to-face evaluation with the patient, counseling and coordination of care as detailed above.   Patient expressed understanding and was in agreement with this plan. He also understands that He can call clinic at any time with any questions, concerns, or complaints.     Lloyd Huger, MD    09/26/2021 10:56 AM

## 2021-09-23 ENCOUNTER — Other Ambulatory Visit: Payer: Managed Care, Other (non HMO)

## 2021-09-23 DIAGNOSIS — R1013 Epigastric pain: Secondary | ICD-10-CM | POA: Insufficient documentation

## 2021-09-25 ENCOUNTER — Encounter: Payer: Self-pay | Admitting: Oncology

## 2021-09-25 ENCOUNTER — Other Ambulatory Visit: Payer: Self-pay

## 2021-09-25 ENCOUNTER — Inpatient Hospital Stay: Payer: Managed Care, Other (non HMO)

## 2021-09-25 ENCOUNTER — Inpatient Hospital Stay: Payer: Managed Care, Other (non HMO) | Attending: Oncology | Admitting: Oncology

## 2021-09-25 VITALS — BP 141/74 | HR 97 | Temp 97.3°F | Resp 16 | Wt 147.4 lb

## 2021-09-25 DIAGNOSIS — D509 Iron deficiency anemia, unspecified: Secondary | ICD-10-CM | POA: Diagnosis not present

## 2021-09-25 DIAGNOSIS — Z79899 Other long term (current) drug therapy: Secondary | ICD-10-CM | POA: Diagnosis not present

## 2021-09-25 NOTE — Progress Notes (Signed)
Pt reports recently had food poisoning but has since recovered. No concerns at this time.

## 2021-09-26 ENCOUNTER — Encounter: Payer: Self-pay | Admitting: Oncology

## 2022-01-20 ENCOUNTER — Other Ambulatory Visit: Payer: Self-pay | Admitting: *Deleted

## 2022-01-20 DIAGNOSIS — D509 Iron deficiency anemia, unspecified: Secondary | ICD-10-CM

## 2022-01-29 ENCOUNTER — Inpatient Hospital Stay: Payer: Managed Care, Other (non HMO) | Attending: Oncology

## 2022-01-29 DIAGNOSIS — D75839 Thrombocytosis, unspecified: Secondary | ICD-10-CM | POA: Diagnosis not present

## 2022-01-29 DIAGNOSIS — Z79899 Other long term (current) drug therapy: Secondary | ICD-10-CM | POA: Insufficient documentation

## 2022-01-29 DIAGNOSIS — E538 Deficiency of other specified B group vitamins: Secondary | ICD-10-CM | POA: Insufficient documentation

## 2022-01-29 DIAGNOSIS — D509 Iron deficiency anemia, unspecified: Secondary | ICD-10-CM | POA: Diagnosis present

## 2022-01-29 LAB — CBC WITH DIFFERENTIAL/PLATELET
Abs Immature Granulocytes: 0.03 10*3/uL (ref 0.00–0.07)
Basophils Absolute: 0 10*3/uL (ref 0.0–0.1)
Basophils Relative: 0 %
Eosinophils Absolute: 0.4 10*3/uL (ref 0.0–0.5)
Eosinophils Relative: 5 %
HCT: 40.9 % (ref 39.0–52.0)
Hemoglobin: 13.8 g/dL (ref 13.0–17.0)
Immature Granulocytes: 0 %
Lymphocytes Relative: 22 %
Lymphs Abs: 1.8 10*3/uL (ref 0.7–4.0)
MCH: 29.8 pg (ref 26.0–34.0)
MCHC: 33.7 g/dL (ref 30.0–36.0)
MCV: 88.3 fL (ref 80.0–100.0)
Monocytes Absolute: 0.4 10*3/uL (ref 0.1–1.0)
Monocytes Relative: 5 %
Neutro Abs: 5.7 10*3/uL (ref 1.7–7.7)
Neutrophils Relative %: 68 %
Platelets: 462 10*3/uL — ABNORMAL HIGH (ref 150–400)
RBC: 4.63 MIL/uL (ref 4.22–5.81)
RDW: 12.8 % (ref 11.5–15.5)
WBC: 8.3 10*3/uL (ref 4.0–10.5)
nRBC: 0 % (ref 0.0–0.2)

## 2022-01-29 LAB — IRON AND TIBC
Iron: 107 ug/dL (ref 45–182)
Saturation Ratios: 17 % — ABNORMAL LOW (ref 17.9–39.5)
TIBC: 631 ug/dL — ABNORMAL HIGH (ref 250–450)
UIBC: 524 ug/dL

## 2022-01-29 LAB — FERRITIN: Ferritin: 8 ng/mL — ABNORMAL LOW (ref 24–336)

## 2022-02-02 ENCOUNTER — Inpatient Hospital Stay (HOSPITAL_BASED_OUTPATIENT_CLINIC_OR_DEPARTMENT_OTHER): Payer: Managed Care, Other (non HMO) | Admitting: Nurse Practitioner

## 2022-02-02 ENCOUNTER — Encounter: Payer: Self-pay | Admitting: Nurse Practitioner

## 2022-02-02 ENCOUNTER — Inpatient Hospital Stay: Payer: Managed Care, Other (non HMO)

## 2022-02-02 VITALS — BP 133/93 | HR 82 | Temp 96.8°F | Resp 16 | Ht 67.0 in | Wt 141.5 lb

## 2022-02-02 VITALS — BP 127/70 | HR 77

## 2022-02-02 DIAGNOSIS — D509 Iron deficiency anemia, unspecified: Secondary | ICD-10-CM

## 2022-02-02 DIAGNOSIS — D75839 Thrombocytosis, unspecified: Secondary | ICD-10-CM

## 2022-02-02 DIAGNOSIS — N200 Calculus of kidney: Secondary | ICD-10-CM | POA: Insufficient documentation

## 2022-02-02 DIAGNOSIS — E538 Deficiency of other specified B group vitamins: Secondary | ICD-10-CM

## 2022-02-02 DIAGNOSIS — K222 Esophageal obstruction: Secondary | ICD-10-CM | POA: Insufficient documentation

## 2022-02-02 DIAGNOSIS — I10 Essential (primary) hypertension: Secondary | ICD-10-CM | POA: Insufficient documentation

## 2022-02-02 DIAGNOSIS — E781 Pure hyperglyceridemia: Secondary | ICD-10-CM | POA: Insufficient documentation

## 2022-02-02 DIAGNOSIS — E785 Hyperlipidemia, unspecified: Secondary | ICD-10-CM | POA: Insufficient documentation

## 2022-02-02 MED ORDER — CYANOCOBALAMIN 1000 MCG/ML IJ SOLN
1000.0000 ug | Freq: Once | INTRAMUSCULAR | Status: AC
  Administered 2022-02-02: 1000 ug via INTRAMUSCULAR
  Filled 2022-02-02: qty 1

## 2022-02-02 MED ORDER — SODIUM CHLORIDE 0.9 % IV SOLN
INTRAVENOUS | Status: DC
  Filled 2022-02-02: qty 250

## 2022-02-02 MED ORDER — SODIUM CHLORIDE 0.9 % IV SOLN: 200.0000 mg | Freq: Once | INTRAVENOUS | Status: DC

## 2022-02-02 MED ORDER — IRON SUCROSE 20 MG/ML IV SOLN
200.0000 mg | Freq: Once | INTRAVENOUS | Status: AC
  Administered 2022-02-02: 200 mg via INTRAVENOUS
  Filled 2022-02-02: qty 10

## 2022-02-02 NOTE — Progress Notes (Signed)
?[facility]  ?Telephone:(336) [phone] [phone]) [phone] ? ?ID: Ryan Bonilla OB: 1955-11-19  MR#: [id number]  CSN#:[id number] ? ?Patient Care Team: ?Gracelyn Nurse, MD as PCP - General (Internal Medicine) ?Jeralyn Ruths, MD as Consulting Physician (Hematology and Oncology) ? ?CHIEF COMPLAINT: Iron deficiency anemia ? ?INTERVAL HISTORY: Patient returns to clinic today for repeat laboratory work, further evaluation, and consideration of additional IV Venofer.  He continues to be active and work full time. Has noticed exercise intolerance though. Denies fatigue or weakness but does feel worn out at times not correlating to exertion. Continues to have intermittent belching, abdominal pain. Was not able to take oral b12 due to constipation. He has no neurologic complaints.  He denies any recent fevers or illnesses.  He has a good appetite and denies weight loss.  He has no chest pain, shortness of breath, cough, or hemoptysis.  He denies any nausea, vomiting, constipation, or diarrhea.  He denies any melena or hematochezia.  He has no urinary complaints.  Patient offers no specific complaints today. ? ?REVIEW OF SYSTEMS:   ?Review of Systems  ?Constitutional:  Positive for malaise/fatigue. Negative for chills, fever and weight loss.  ?HENT:  Negative for hearing loss, nosebleeds, sore throat and tinnitus.   ?Eyes:  Negative for blurred vision and double vision.  ?Respiratory:  Negative for cough, hemoptysis, shortness of breath and wheezing.   ?Cardiovascular:  Negative for chest pain, palpitations and leg swelling.  ?Gastrointestinal:  Negative for abdominal pain, blood in stool, constipation, diarrhea, melena, nausea and vomiting.  ?Genitourinary:  Negative for dysuria and urgency.  ?Musculoskeletal:  Negative for back pain, falls, joint pain and myalgias.  ?Skin:  Negative for itching and rash.  ?Neurological:  Negative for dizziness, tingling, sensory change, loss of consciousness,  weakness and headaches.  ?Endo/Heme/Allergies:  Negative for environmental allergies. Does not bruise/bleed easily.  ?Psychiatric/Behavioral:  Negative for depression. The patient is not nervous/anxious and does not have insomnia.   ?As per HPI. Otherwise, a complete review of systems is negative. ? ?PAST MEDICAL HISTORY: ?Past Medical History:  ?Diagnosis Date  ? Esophageal stricture   ? Hyperlipidemia   ? Hypertension   ? Kidney stones   ? ? ?PAST SURGICAL HISTORY: ?Past Surgical History:  ?Procedure Laterality Date  ? COLONOSCOPY    ? COLONOSCOPY WITH PROPOFOL N/A 10/10/2015  ? Procedure: COLONOSCOPY WITH PROPOFOL;  Surgeon: Wallace Cullens, MD;  Location: [facility] ENDOSCOPY;  Service: Gastroenterology;  Laterality: N/A;  ? ESOPHAGOGASTRODUODENOSCOPY    ? ESOPHAGOGASTRODUODENOSCOPY (EGD) WITH PROPOFOL N/A 10/10/2015  ? Procedure: ESOPHAGOGASTRODUODENOSCOPY (EGD) WITH PROPOFOL;  Surgeon: Wallace Cullens, MD;  Location: [facility] ENDOSCOPY;  Service: Gastroenterology;  Laterality: N/A;  ? ? ?FAMILY HISTORY: ?Family History  ?Problem Relation Age of Onset  ? Hypertension Father   ? ? ?ADVANCED DIRECTIVES (Y/N):  N ? ?HEALTH MAINTENANCE: ?Social History  ? ?Tobacco Use  ? Smoking status: Never  ? Smokeless tobacco: Never  ?Substance Use Topics  ? Alcohol use: Not Currently  ?  Alcohol/week: 0.0 standard drinks  ? Drug use: Not Currently  ? ? Colonoscopy: ? PAP: ? Bone density: ? Lipid panel: ? ?Allergies  ?Allergen Reactions  ? Penicillins   ? ? ?Current Outpatient Medications  ?Medication Sig Dispense Refill  ? atorvastatin (LIPITOR) 10 MG tablet Take 10 mg by mouth daily.    ? fluticasone (FLONASE) 50 MCG/ACT nasal spray USE 2 SPRAYS IN EACH  NOSTRIL DAILY (Patient taking differently: Place 2 sprays into  both nostrils daily.) 48 g 3  ? gemfibrozil (LOPID) 600 MG tablet Take 600 mg by mouth 2 (two) times daily before a meal.    ? lisinopril (PRINIVIL,ZESTRIL) 10 MG tablet Take 10 mg by mouth daily.    ? ?No current facility-administered  medications for this visit.  ? ? ?OBJECTIVE: ?Vitals:  ? 02/02/22 1300  ?BP: (!) 133/93  ?Pulse: 82  ?Resp: 16  ?Temp: (!) 96.8 ?F (36 ?C)  ?SpO2: 99%  ?   Body mass index is 22.16 kg/m?Marland Kitchen    ECOG FS:0 - Asymptomatic ? ?General: Well-developed, well-nourished, no acute distress. ?Eyes: Pink conjunctiva, anicteric sclera. ?Lungs: Clear to auscultation bilaterally.  No audible wheezing or coughing ?Heart: Regular rate and rhythm.  ?Abdomen: Soft, nontender, nondistended.  ?Musculoskeletal: No edema, cyanosis, or clubbing. ?Neuro: Alert, answering all questions appropriately. Cranial nerves grossly intact. ?Skin: No rashes or petechiae noted. ?Psych: Normal affect. ? ?LAB RESULTS: ? ?Lab Results  ?Component Value Date  ? NA 138 12/18/2019  ? K 3.9 12/18/2019  ? CL 104 12/18/2019  ? CO2 28 12/18/2019  ? GLUCOSE 70 03/12/2021  ? BUN 17 12/18/2019  ? CREATININE 0.93 12/18/2019  ? CALCIUM 9.5 12/18/2019  ? PROT 7.9 12/18/2019  ? ALBUMIN 4.3 12/18/2019  ? AST 18 12/18/2019  ? ALT 18 12/18/2019  ? ALKPHOS 73 12/18/2019  ? BILITOT 0.7 12/18/2019  ? GFRNONAA >60 12/18/2019  ? GFRAA >60 12/18/2019  ? ? ?Lab Results  ?Component Value Date  ? WBC 8.3 01/29/2022  ? NEUTROABS 5.7 01/29/2022  ? HGB 13.8 01/29/2022  ? HCT 40.9 01/29/2022  ? MCV 88.3 01/29/2022  ? PLT 462 (H) 01/29/2022  ? ?Lab Results  ?Component Value Date  ? IRON 107 01/29/2022  ? TIBC 631 (H) 01/29/2022  ? IRONPCTSAT 17 (L) 01/29/2022  ? ?Lab Results  ?Component Value Date  ? FERRITIN 8 (L) 01/29/2022  ? ? ? ? ?STUDIES: ?No results found. ? ?ASSESSMENT: Iron deficiency anemia. ? ?PLAN:   ? ?1. Iron deficiency anemia: Patient's hemoglobin and iron stores stable and within normal limits. EGD and colonoscopy in November 2021 were without significant pathology; Mild gastritis. Ferritin however, has dropped to 8, iron saturation 17%. Question malabsorption vs blood loss. Last received IV Venofer 06/06/21. Proceed with venofer x 3.  ?2.  Thrombocytosis: 462. Question if  reactive to IDA. Monitor.  ?3. B12 deficiency- B12 noted to be low with GI at 154. Question malabsorption/pernicious anemia? Will check intrinsic factor antibodies at next visit. Start b12 loading 1000 iu weekly x 4 weeks then monthly. Check b12 and folate at next visit.  ?4. Chronic upper abdominal pain/dyspepsia- managed by GI. ? ?Disposition: ?Venofer x 3 ?B12 weekly x 4 then monthly ?Rtc in 3 months for labs (cbc, bmp, ferritin, iron studies, b12, folate, intrinsic factor antibodies) ?Day to week later see me, +/- venofer & b12- la ? ?I spent a total of 30 minutes reviewing chart data, face-to-face evaluation with the patient, counseling and coordination of care as detailed above. ? ?Patient expressed understanding and was in agreement with this plan. He also understands that He can call clinic at any time with any questions, concerns, or complaints.  ? ?Alinda Dooms, NP   02/02/2022  ? ? ? ? ?

## 2022-02-02 NOTE — Patient Instructions (Signed)
Vitamin B12 Injection ?What is this medication? ?Vitamin B12 (VAHY tuh min B12) prevents and treats low vitamin B12 levels in your body. It is used in people who do not get enough vitamin B12 from their diet or when their digestive tract does not absorb enough. Vitamin B12 plays an important role in maintaining the health of your nervous system and red blood cells. ?This medicine may be used for other purposes; ask your health care provider or pharmacist if you have questions. ?COMMON BRAND NAME(S): B-12 Compliance Kit, B-12 Injection Kit, Cyomin, Dodex, LA-12, Nutri-Twelve, Physicians EZ Use B-12, Primabalt ?What should I tell my care team before I take this medication? ?They need to know if you have any of these conditions: ?Kidney disease ?Leber's disease ?Megaloblastic anemia ?An unusual or allergic reaction to cyanocobalamin, cobalt, other medications, foods, dyes, or preservatives ?Pregnant or trying to get pregnant ?Breast-feeding ?How should I use this medication? ?This medication is injected into a muscle or deeply under the skin. It is usually given in a clinic or care team's office. However, your care team may teach you how to inject yourself. Follow all instructions. ?Talk to your care team about the use of this medication in children. Special care may be needed. ?Overdosage: If you think you have taken too much of this medicine contact a poison control center or emergency room at once. ?NOTE: This medicine is only for you. Do not share this medicine with others. ?What if I miss a dose? ?If you are given your dose at a clinic or care team's office, call to reschedule your appointment. If you give your own injections, and you miss a dose, take it as soon as you can. If it is almost time for your next dose, take only that dose. Do not take double or extra doses. ?What may interact with this medication? ?Colchicine ?Heavy alcohol intake ?This list may not describe all possible interactions. Give your health  care provider a list of all the medicines, herbs, non-prescription drugs, or dietary supplements you use. Also tell them if you smoke, drink alcohol, or use illegal drugs. Some items may interact with your medicine. ?What should I watch for while using this medication? ?Visit your care team regularly. You may need blood work done while you are taking this medication. ?You may need to follow a special diet. Talk to your care team. Limit your alcohol intake and avoid smoking to get the best benefit. ?What side effects may I notice from receiving this medication? ?Side effects that you should report to your care team as soon as possible: ?Allergic reactions--skin rash, itching, hives, swelling of the face, lips, tongue, or throat ?Swelling of the ankles, hands, or feet ?Trouble breathing ?Side effects that usually do not require medical attention (report to your care team if they continue or are bothersome): ?Diarrhea ?This list may not describe all possible side effects. Call your doctor for medical advice about side effects. You may report side effects to FDA at 1-800-FDA-1088. ?Where should I keep my medication? ?Keep out of the reach of children. ?Store at room temperature between 15 and 30 degrees C (59 and 85 degrees F). Protect from light. Throw away any unused medication after the expiration date. ?NOTE: This sheet is a summary. It may not cover all possible information. If you have questions about this medicine, talk to your doctor, pharmacist, or health care provider. ?? 2022 Elsevier/Gold Standard (2020-12-25 00:00:00) ?Iron Sucrose Injection ?What is this medication? ?IRON SUCROSE (EYE ern SOO   krose) treats low levels of iron (iron deficiency anemia) in people with kidney disease. Iron is a mineral that plays an important role in making red blood cells, which carry oxygen from your lungs to the rest of your body. ?This medicine may be used for other purposes; ask your health care provider or pharmacist if  you have questions. ?COMMON BRAND NAME(S): Venofer ?What should I tell my care team before I take this medication? ?They need to know if you have any of these conditions: ?Anemia not caused by low iron levels ?Heart disease ?High levels of iron in the blood ?Kidney disease ?Liver disease ?An unusual or allergic reaction to iron, other medications, foods, dyes, or preservatives ?Pregnant or trying to get pregnant ?Breast-feeding ?How should I use this medication? ?This medication is for infusion into a vein. It is given in a hospital or clinic setting. ?Talk to your care team about the use of this medication in children. While this medication may be prescribed for children as young as 2 years for selected conditions, precautions do apply. ?Overdosage: If you think you have taken too much of this medicine contact a poison control center or emergency room at once. ?NOTE: This medicine is only for you. Do not share this medicine with others. ?What if I miss a dose? ?It is important not to miss your dose. Call your care team if you are unable to keep an appointment. ?What may interact with this medication? ?Do not take this medication with any of the following: ?Deferoxamine ?Dimercaprol ?Other iron products ?This medication may also interact with the following: ?Chloramphenicol ?Deferasirox ?This list may not describe all possible interactions. Give your health care provider a list of all the medicines, herbs, non-prescription drugs, or dietary supplements you use. Also tell them if you smoke, drink alcohol, or use illegal drugs. Some items may interact with your medicine. ?What should I watch for while using this medication? ?Visit your care team regularly. Tell your care team if your symptoms do not start to get better or if they get worse. You may need blood work done while you are taking this medication. ?You may need to follow a special diet. Talk to your care team. Foods that contain iron include: whole  grains/cereals, dried fruits, beans, or peas, leafy green vegetables, and organ meats (liver, kidney). ?What side effects may I notice from receiving this medication? ?Side effects that you should report to your care team as soon as possible: ?Allergic reactions--skin rash, itching, hives, swelling of the face, lips, tongue, or throat ?Low blood pressure--dizziness, feeling faint or lightheaded, blurry vision ?Shortness of breath ?Side effects that usually do not require medical attention (report to your care team if they continue or are bothersome): ?Flushing ?Headache ?Joint pain ?Muscle pain ?Nausea ?Pain, redness, or irritation at injection site ?This list may not describe all possible side effects. Call your doctor for medical advice about side effects. You may report side effects to FDA at 1-800-FDA-1088. ?Where should I keep my medication? ?This medication is given in a hospital or clinic and will not be stored at home. ?NOTE: This sheet is a summary. It may not cover all possible information. If you have questions about this medicine, talk to your doctor, pharmacist, or health care provider. ?? 2022 Elsevier/Gold Standard (2021-03-07 00:00:00) ? ?

## 2022-02-09 ENCOUNTER — Inpatient Hospital Stay: Payer: Managed Care, Other (non HMO)

## 2022-02-09 VITALS — BP 132/75 | HR 73 | Temp 97.9°F

## 2022-02-09 DIAGNOSIS — D509 Iron deficiency anemia, unspecified: Secondary | ICD-10-CM

## 2022-02-09 MED ORDER — SODIUM CHLORIDE 0.9 % IV SOLN
INTRAVENOUS | Status: DC
  Filled 2022-02-09: qty 250

## 2022-02-09 MED ORDER — CYANOCOBALAMIN 1000 MCG/ML IJ SOLN
1000.0000 ug | Freq: Once | INTRAMUSCULAR | Status: AC
  Administered 2022-02-09: 1000 ug via INTRAMUSCULAR
  Filled 2022-02-09: qty 1

## 2022-02-09 MED ORDER — IRON SUCROSE 20 MG/ML IV SOLN
200.0000 mg | Freq: Once | INTRAVENOUS | Status: AC
  Administered 2022-02-09: 200 mg via INTRAVENOUS
  Filled 2022-02-09: qty 10

## 2022-02-09 MED ORDER — SODIUM CHLORIDE 0.9 % IV SOLN: 200.0000 mg | Freq: Once | INTRAVENOUS | Status: DC

## 2022-02-19 ENCOUNTER — Inpatient Hospital Stay: Payer: Managed Care, Other (non HMO)

## 2022-02-19 VITALS — BP 124/72 | HR 82 | Temp 98.0°F

## 2022-02-19 DIAGNOSIS — D509 Iron deficiency anemia, unspecified: Secondary | ICD-10-CM

## 2022-02-19 MED ORDER — IRON SUCROSE 20 MG/ML IV SOLN
200.0000 mg | Freq: Once | INTRAVENOUS | Status: AC
  Administered 2022-02-19: 200 mg via INTRAVENOUS
  Filled 2022-02-19: qty 10

## 2022-02-19 MED ORDER — SODIUM CHLORIDE 0.9 % IV SOLN: 200.0000 mg | Freq: Once | INTRAVENOUS | Status: DC

## 2022-02-19 MED ORDER — SODIUM CHLORIDE 0.9 % IV SOLN
Freq: Once | INTRAVENOUS | Status: AC
  Filled 2022-02-19: qty 250

## 2022-02-19 MED ORDER — CYANOCOBALAMIN 1000 MCG/ML IJ SOLN
1000.0000 ug | Freq: Once | INTRAMUSCULAR | Status: AC
  Administered 2022-02-19: 1000 ug via INTRAMUSCULAR
  Filled 2022-02-19: qty 1

## 2022-02-19 NOTE — Patient Instructions (Signed)

## 2022-02-26 ENCOUNTER — Inpatient Hospital Stay: Payer: Managed Care, Other (non HMO) | Attending: Oncology

## 2022-02-26 DIAGNOSIS — E538 Deficiency of other specified B group vitamins: Secondary | ICD-10-CM | POA: Diagnosis present

## 2022-02-26 DIAGNOSIS — D509 Iron deficiency anemia, unspecified: Secondary | ICD-10-CM | POA: Insufficient documentation

## 2022-02-26 MED ORDER — CYANOCOBALAMIN 1000 MCG/ML IJ SOLN
1000.0000 ug | Freq: Once | INTRAMUSCULAR | Status: AC
  Administered 2022-02-26: 1000 ug via INTRAMUSCULAR
  Filled 2022-02-26: qty 1

## 2022-03-05 ENCOUNTER — Inpatient Hospital Stay: Payer: Managed Care, Other (non HMO)

## 2022-03-05 DIAGNOSIS — D509 Iron deficiency anemia, unspecified: Secondary | ICD-10-CM

## 2022-03-05 DIAGNOSIS — E538 Deficiency of other specified B group vitamins: Secondary | ICD-10-CM | POA: Diagnosis not present

## 2022-03-05 MED ORDER — CYANOCOBALAMIN 1000 MCG/ML IJ SOLN
1000.0000 ug | Freq: Once | INTRAMUSCULAR | Status: AC
  Administered 2022-03-05: 1000 ug via INTRAMUSCULAR
  Filled 2022-03-05: qty 1

## 2022-03-26 ENCOUNTER — Telehealth: Payer: Self-pay | Admitting: *Deleted

## 2022-03-26 ENCOUNTER — Encounter: Payer: Self-pay | Admitting: Nurse Practitioner

## 2022-03-26 NOTE — Telephone Encounter (Signed)
Patient called reporting that he "has had a series of attacks of weakness" which he feels is related to his blood and feels that he needs to be seen before his next scheduled appointment in July. Please advise

## 2022-03-27 ENCOUNTER — Inpatient Hospital Stay: Payer: Managed Care, Other (non HMO) | Attending: Oncology

## 2022-03-27 DIAGNOSIS — Z79899 Other long term (current) drug therapy: Secondary | ICD-10-CM | POA: Insufficient documentation

## 2022-03-27 DIAGNOSIS — D509 Iron deficiency anemia, unspecified: Secondary | ICD-10-CM | POA: Diagnosis present

## 2022-03-27 DIAGNOSIS — R1013 Epigastric pain: Secondary | ICD-10-CM | POA: Diagnosis not present

## 2022-03-27 DIAGNOSIS — D75839 Thrombocytosis, unspecified: Secondary | ICD-10-CM | POA: Insufficient documentation

## 2022-03-27 DIAGNOSIS — E538 Deficiency of other specified B group vitamins: Secondary | ICD-10-CM | POA: Insufficient documentation

## 2022-03-27 LAB — CBC WITH DIFFERENTIAL/PLATELET
Abs Immature Granulocytes: 0.04 10*3/uL (ref 0.00–0.07)
Basophils Absolute: 0 10*3/uL (ref 0.0–0.1)
Basophils Relative: 0 %
Eosinophils Absolute: 0.1 10*3/uL (ref 0.0–0.5)
Eosinophils Relative: 1 %
HCT: 49.5 % (ref 39.0–52.0)
Hemoglobin: 17 g/dL (ref 13.0–17.0)
Immature Granulocytes: 0 %
Lymphocytes Relative: 10 %
Lymphs Abs: 1.3 10*3/uL (ref 0.7–4.0)
MCH: 30.6 pg (ref 26.0–34.0)
MCHC: 34.3 g/dL (ref 30.0–36.0)
MCV: 89 fL (ref 80.0–100.0)
Monocytes Absolute: 0.5 10*3/uL (ref 0.1–1.0)
Monocytes Relative: 3 %
Neutro Abs: 11.5 10*3/uL — ABNORMAL HIGH (ref 1.7–7.7)
Neutrophils Relative %: 86 %
Platelets: 528 10*3/uL — ABNORMAL HIGH (ref 150–400)
RBC: 5.56 MIL/uL (ref 4.22–5.81)
RDW: 13.5 % (ref 11.5–15.5)
WBC: 13.4 10*3/uL — ABNORMAL HIGH (ref 4.0–10.5)
nRBC: 0 % (ref 0.0–0.2)

## 2022-03-27 LAB — IRON AND TIBC
Iron: 95 ug/dL (ref 45–182)
Saturation Ratios: 18 % (ref 17.9–39.5)
TIBC: 518 ug/dL — ABNORMAL HIGH (ref 250–450)
UIBC: 423 ug/dL

## 2022-03-27 LAB — FERRITIN: Ferritin: 79 ng/mL (ref 24–336)

## 2022-03-27 LAB — VITAMIN B12: Vitamin B-12: 404 pg/mL (ref 180–914)

## 2022-03-27 LAB — FOLATE: Folate: 20.7 ng/mL (ref >5.9)

## 2022-03-30 LAB — INTRINSIC FACTOR ANTIBODIES: Intrinsic Factor: 1.1 [AU]/ml (ref 0.0–1.1)

## 2022-04-06 ENCOUNTER — Telehealth: Payer: Self-pay | Admitting: *Deleted

## 2022-04-06 NOTE — Telephone Encounter (Signed)
Patient called stating he was to have gotten a call from Burna Mortimer, NP regarding his results and his symptoms. He is getting ready to go on vacation and does not want to be sick all week. Having difficulty eating and weakness.I advised patient that he is seeing PA tomorrow when he comes in for his injection. He said that is fine and he will just wait until then for discussion

## 2022-04-07 ENCOUNTER — Inpatient Hospital Stay (HOSPITAL_BASED_OUTPATIENT_CLINIC_OR_DEPARTMENT_OTHER): Payer: Managed Care, Other (non HMO) | Admitting: Medical Oncology

## 2022-04-07 ENCOUNTER — Inpatient Hospital Stay: Payer: Managed Care, Other (non HMO)

## 2022-04-07 VITALS — BP 132/92 | HR 93 | Temp 98.4°F | Wt 141.6 lb

## 2022-04-07 DIAGNOSIS — E538 Deficiency of other specified B group vitamins: Secondary | ICD-10-CM

## 2022-04-07 DIAGNOSIS — D75839 Thrombocytosis, unspecified: Secondary | ICD-10-CM

## 2022-04-07 DIAGNOSIS — D509 Iron deficiency anemia, unspecified: Secondary | ICD-10-CM

## 2022-04-07 MED ORDER — CYANOCOBALAMIN 1000 MCG/ML IJ SOLN
1000.0000 ug | Freq: Once | INTRAMUSCULAR | Status: AC
  Administered 2022-04-07: 1000 ug via INTRAMUSCULAR
  Filled 2022-04-07: qty 1

## 2022-04-07 NOTE — Progress Notes (Unsigned)
[facility]  [phone] [phone]) [phone]  ID: CLIF SERIO OB: [birth year]  MR#: [id number]  XAJ#:[id number]  Patient Care Team: Baxter Hire, MD as PCP - General (Internal Medicine) Lloyd Huger, MD as Consulting Physician (Hematology and Oncology)  CHIEF COMPLAINT: Iron deficiency anemia  INTERVAL HISTORY: Patient returns to clinic today for repeat laboratory work, further evaluation, and consideration of additional IV Venofer. He reports that he is feeling ok- had some stomach pains with GI upset about a week ago. Thought his iron was low due to this as it has in the past. Working with GI and his PCP on this. Currently he is tolerating his B12 well. Wishes to have a nurse coworker administer this for him but wishes to have his June dose today. He has no chest pain, shortness of breath, cough, or hemoptysis.  He denies any nausea, vomiting, constipation, or diarrhea.  He denies any melena or hematochezia.  He has no urinary complaints.  Patient offers no specific complaints today.  REVIEW OF SYSTEMS:   Review of Systems  Constitutional:  Positive for malaise/fatigue. Negative for chills, fever and weight loss.  HENT:  Negative for hearing loss, nosebleeds, sore throat and tinnitus.   Eyes:  Negative for blurred vision and double vision.  Respiratory:  Negative for cough, hemoptysis, shortness of breath and wheezing.   Cardiovascular:  Negative for chest pain, palpitations and leg swelling.  Gastrointestinal:  Negative for abdominal pain, blood in stool, constipation, diarrhea, melena, nausea and vomiting.  Genitourinary:  Negative for dysuria and urgency.  Musculoskeletal:  Negative for back pain, falls, joint pain and myalgias.  Skin:  Negative for itching and rash.  Neurological:  Negative for dizziness, tingling, sensory change, loss of consciousness, weakness and headaches.  Endo/Heme/Allergies:  Negative for environmental allergies. Does  not bruise/bleed easily.  Psychiatric/Behavioral:  Negative for depression. The patient is not nervous/anxious and does not have insomnia.    As per HPI. Otherwise, a complete review of systems is negative.  PAST MEDICAL HISTORY: Past Medical History:  Diagnosis Date   Esophageal stricture    Hyperlipidemia    Hypertension    Kidney stones     PAST SURGICAL HISTORY: Past Surgical History:  Procedure Laterality Date   COLONOSCOPY     COLONOSCOPY WITH PROPOFOL N/A 10/10/2015   Procedure: COLONOSCOPY WITH PROPOFOL;  Surgeon: Hulen Luster, MD;  Location: [facility] ENDOSCOPY;  Service: Gastroenterology;  Laterality: N/A;   ESOPHAGOGASTRODUODENOSCOPY     ESOPHAGOGASTRODUODENOSCOPY (EGD) WITH PROPOFOL N/A 10/10/2015   Procedure: ESOPHAGOGASTRODUODENOSCOPY (EGD) WITH PROPOFOL;  Surgeon: Hulen Luster, MD;  Location: [facility] ENDOSCOPY;  Service: Gastroenterology;  Laterality: N/A;    FAMILY HISTORY: Family History  Problem Relation Age of Onset   Hypertension Father     ADVANCED DIRECTIVES (Y/N):  N  HEALTH MAINTENANCE: Social History   Tobacco Use   Smoking status: Never   Smokeless tobacco: Never  Substance Use Topics   Alcohol use: Not Currently    Alcohol/week: 0.0 standard drinks of alcohol   Drug use: Not Currently    Colonoscopy:  PAP:  Bone density:  Lipid panel:  Allergies  Allergen Reactions   Penicillins     Current Outpatient Medications  Medication Sig Dispense Refill   atorvastatin (LIPITOR) 10 MG tablet Take 10 mg by mouth daily.     fluticasone (FLONASE) 50 MCG/ACT nasal spray USE 2 SPRAYS IN EACH  NOSTRIL DAILY (Patient taking differently: Place 2 sprays into both nostrils daily.)  48 g 3   gemfibrozil (LOPID) 600 MG tablet Take 600 mg by mouth 2 (two) times daily before a meal.     lisinopril (PRINIVIL,ZESTRIL) 10 MG tablet Take 10 mg by mouth daily.     No current facility-administered medications for this visit.    OBJECTIVE: Vitals:   04/07/22 1355  BP:  (!) 132/92  Pulse: 93  Temp: 98.4 F (36.9 C)     Body mass index is 22.18 kg/m.    ECOG FS:0 - Asymptomatic  General: Well-developed, well-nourished, no acute distress. Eyes: Pink conjunctiva, anicteric sclera. Lungs: Clear to auscultation bilaterally.  No audible wheezing or coughing Heart: Regular rate and rhythm.  Abdomen: Soft, nontender, nondistended.  Musculoskeletal: No edema, cyanosis, or clubbing. Neuro: Alert, answering all questions appropriately. Cranial nerves grossly intact. Skin: No rashes or petechiae noted. Psych: Normal affect.  LAB RESULTS:  Lab Results  Component Value Date   NA 138 12/18/2019   K 3.9 12/18/2019   CL 104 12/18/2019   CO2 28 12/18/2019   GLUCOSE 70 03/12/2021   BUN 17 12/18/2019   CREATININE 0.93 12/18/2019   CALCIUM 9.5 12/18/2019   PROT 7.9 12/18/2019   ALBUMIN 4.3 12/18/2019   AST 18 12/18/2019   ALT 18 12/18/2019   ALKPHOS 73 12/18/2019   BILITOT 0.7 12/18/2019   GFRNONAA >60 12/18/2019   GFRAA >60 12/18/2019    Lab Results  Component Value Date   WBC 13.4 (H) 03/27/2022   NEUTROABS 11.5 (H) 03/27/2022   HGB 17.0 03/27/2022   HCT 49.5 03/27/2022   MCV 89.0 03/27/2022   PLT 528 (H) 03/27/2022   Lab Results  Component Value Date   IRON 95 03/27/2022   TIBC 518 (H) 03/27/2022   IRONPCTSAT 18 03/27/2022   Lab Results  Component Value Date   FERRITIN 79 03/27/2022      STUDIES: No results found.  ASSESSMENT: Iron deficiency anemia.  PLAN:    1. Iron deficiency anemia: His hemoglobin and iron stores are stable. EGD and colonoscopy in November 2021 were without significant pathology; Mild gastritis. No Venofer needed at this time. RTC 3 months.  2.  Thrombocytosis: 528- previously told to hold ASA by GI. Has trended up and down in the past. Will plan for further work up at next lab draw including CBC, smear, ESR, CRP, ANA, RF, Iron/TIBC, ferritin).  3. B12 deficiency-Intrinsic factor normal level. Continue monthly  B12 - kit sent to pharmacy  4. Chronic upper abdominal pain/dyspepsia- managed by GI. Suggested probiotics and SSRI discussion with PCP/GI.   Disposition: Rtc in 3 months for labs (cbc, bmp, ferritin, Iron/TIBC, b12, folate, peripheral blood smear, ESR, CRP, ANA, RF).  Day to week later see me, +/- venofer   I spent a total of 30 minutes reviewing chart data, face-to-face evaluation with the patient, counseling and coordination of care as detailed above.  Patient expressed understanding and was in agreement with this plan. He also understands that He can call clinic at any time with any questions, concerns, or complaints.   Hughie Closs, PA-C   04/07/2022

## 2022-04-08 ENCOUNTER — Encounter: Payer: Self-pay | Admitting: Oncology

## 2022-04-08 MED ORDER — CYANOCOBALAMIN 1000 MCG/ML IJ KIT: 1.0000 | PACK | INTRAMUSCULAR | 12 refills | Status: DC

## 2022-05-01 ENCOUNTER — Other Ambulatory Visit: Payer: Managed Care, Other (non HMO)

## 2022-05-04 ENCOUNTER — Other Ambulatory Visit: Payer: Managed Care, Other (non HMO)

## 2022-05-04 ENCOUNTER — Ambulatory Visit: Payer: Managed Care, Other (non HMO) | Admitting: Medical Oncology

## 2022-05-04 ENCOUNTER — Ambulatory Visit: Payer: Managed Care, Other (non HMO)

## 2022-05-08 ENCOUNTER — Other Ambulatory Visit: Payer: Self-pay | Admitting: *Deleted

## 2022-05-08 MED ORDER — CYANOCOBALAMIN 1000 MCG/ML IJ KIT: 1.0000 | PACK | INTRAMUSCULAR | 12 refills | Status: DC

## 2022-05-08 NOTE — Telephone Encounter (Signed)
Patient called reporting that his vial of B 12 has inadvertently apparently has been thrown out and he needs a dose for today. He is asking if we can refill this for him

## 2022-05-11 ENCOUNTER — Other Ambulatory Visit: Payer: Self-pay | Admitting: *Deleted

## 2022-05-11 MED ORDER — "SYRINGE 25G X 1"" 3 ML MISC": 3 refills | Status: DC

## 2022-05-11 MED ORDER — CYANOCOBALAMIN 1000 MCG/ML IJ SOLN: INTRAMUSCULAR | 3 refills | Status: AC

## 2022-05-11 NOTE — Telephone Encounter (Signed)
[facility] does not carry the B 12 kits so they are requesting to send Cyanocobalamin 1000 u/ ml vial and syringes

## 2022-05-12 ENCOUNTER — Encounter: Payer: Self-pay | Admitting: Oncology

## 2022-06-25 ENCOUNTER — Telehealth: Payer: Self-pay | Admitting: *Deleted

## 2022-06-25 NOTE — Telephone Encounter (Signed)
Patient called asking that we refer him to GI at [facility] and to let him know when it is done

## 2022-06-25 NOTE — Telephone Encounter (Signed)
Per Dr. Orlie Dakin, recommends patient speaks to his PCP for referral. Thanks

## 2022-06-25 NOTE — Telephone Encounter (Signed)
Patient advised to please contact PCP for referral. 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 I told him that he cannot dictate that he see a [facility] doctor if he is asking for [facility]. He said he will call him and try to get referral

## 2022-07-08 ENCOUNTER — Other Ambulatory Visit: Payer: Self-pay

## 2022-07-08 DIAGNOSIS — D509 Iron deficiency anemia, unspecified: Secondary | ICD-10-CM

## 2022-07-08 MED FILL — Iron Sucrose Inj 20 MG/ML (Fe Equiv): INTRAVENOUS | Qty: 10 | Status: AC

## 2022-07-08 NOTE — Progress Notes (Signed)
[facility]  [phone] Fax:(336) [phone]  ID: Ryan Bonilla OB: [birth year]  MR#: [id number]  YYQ#:[id number]  Patient Care Team: Baxter Hire, MD as PCP - General (Internal Medicine) Lloyd Huger, MD as Consulting Physician (Hematology and Oncology)  CHIEF COMPLAINT: Iron deficiency anemia.  INTERVAL HISTORY: Patient returns to clinic today for repeat laboratory work, further evaluation, consideration of additional IV Venofer.  He continues to have chronic abdominal pain, poor appetite, and early satiety but states GI has not found a specific etiology.  He has chronic fatigue, but otherwise feels well.  He has no neurologic complaints.  He denies any recent fevers or illnesses.  He has no chest pain, shortness of breath, cough, or hemoptysis.  He denies any nausea, vomiting, constipation, or diarrhea.  He denies any melena or hematochezia.  He has no urinary complaints.  Patient is no specific complaints today.  REVIEW OF SYSTEMS:   Review of Systems  Constitutional: Negative.  Negative for fever, malaise/fatigue and weight loss.  Respiratory: Negative.  Negative for cough, hemoptysis and shortness of breath.   Cardiovascular: Negative.  Negative for chest pain and leg swelling.  Gastrointestinal: Negative.  Negative for abdominal pain, blood in stool and melena.  Genitourinary: Negative.  Negative for hematuria.  Musculoskeletal: Negative.  Negative for back pain.  Skin: Negative.  Negative for rash.  Neurological: Negative.  Negative for dizziness, focal weakness, weakness and headaches.  Psychiatric/Behavioral: Negative.  The patient is not nervous/anxious.     As per HPI. Otherwise, a complete review of systems is negative.  PAST MEDICAL HISTORY: Past Medical History:  Diagnosis Date   Esophageal stricture    Hyperlipidemia    Hypertension    Kidney stones     PAST SURGICAL HISTORY: Past Surgical History:  Procedure Laterality  Date   COLONOSCOPY     COLONOSCOPY WITH PROPOFOL N/A 10/10/2015   Procedure: COLONOSCOPY WITH PROPOFOL;  Surgeon: Hulen Luster, MD;  Location: [facility] ENDOSCOPY;  Service: Gastroenterology;  Laterality: N/A;   ESOPHAGOGASTRODUODENOSCOPY     ESOPHAGOGASTRODUODENOSCOPY (EGD) WITH PROPOFOL N/A 10/10/2015   Procedure: ESOPHAGOGASTRODUODENOSCOPY (EGD) WITH PROPOFOL;  Surgeon: Hulen Luster, MD;  Location: [facility] ENDOSCOPY;  Service: Gastroenterology;  Laterality: N/A;    FAMILY HISTORY: Family History  Problem Relation Age of Onset   Hypertension Father     ADVANCED DIRECTIVES (Y/N):  N  HEALTH MAINTENANCE: Social History   Tobacco Use   Smoking status: Never   Smokeless tobacco: Never  Substance Use Topics   Alcohol use: Not Currently    Alcohol/week: 0.0 standard drinks of alcohol   Drug use: Not Currently     Colonoscopy:  PAP:  Bone density:  Lipid panel:  Allergies  Allergen Reactions   Penicillins     Current Outpatient Medications  Medication Sig Dispense Refill   cyanocobalamin (,VITAMIN B-12,) 1000 MCG/ML injection Inject 1 ml (1000 units) every 30 days 3 mL 3   Cyanocobalamin 1000 MCG/ML KIT Inject 1 Dose as directed every 30 (thirty) days. 1 kit 12   fluticasone (FLONASE) 50 MCG/ACT nasal spray USE 2 SPRAYS IN EACH  NOSTRIL DAILY (Patient taking differently: Place 2 sprays into both nostrils daily.) 48 g 3   gemfibrozil (LOPID) 600 MG tablet Take 600 mg by mouth 2 (two) times daily before a meal.     lisinopril (ZESTRIL) 20 MG tablet Take 20 mg by mouth daily.     Syringe/Needle, Disp, (SYRINGE 3CC/25GX1") 25G X 1" 3 ML MISC  Use to inject Cyanocobalamin every 30 days 3 each 3   atorvastatin (LIPITOR) 10 MG tablet Take 10 mg by mouth daily. (Patient not taking: Reported on 07/09/2022)     No current facility-administered medications for this visit.    OBJECTIVE: Vitals:   07/09/22 1411  BP: 121/77  Pulse: 95  Resp: 16  Temp: 99.4 F (37.4 C)  SpO2: 99%     Body  mass index is 21.38 kg/m.    ECOG FS:0 - Asymptomatic  General: Well-developed, well-nourished, no acute distress. Eyes: Pink conjunctiva, anicteric sclera. HEENT: Normocephalic, moist mucous membranes. Lungs: No audible wheezing or coughing. Heart: Regular rate and rhythm. Abdomen: Soft, nontender, no obvious distention. Musculoskeletal: No edema, cyanosis, or clubbing. Neuro: Alert, answering all questions appropriately. Cranial nerves grossly intact. Skin: No rashes or petechiae noted. Psych: Normal affect.  LAB RESULTS:  Lab Results  Component Value Date   NA 138 12/18/2019   K 3.9 12/18/2019   CL 104 12/18/2019   CO2 28 12/18/2019   GLUCOSE 70 03/12/2021   BUN 17 12/18/2019   CREATININE 0.93 12/18/2019   CALCIUM 9.5 12/18/2019   PROT 7.9 12/18/2019   ALBUMIN 4.3 12/18/2019   AST 18 12/18/2019   ALT 18 12/18/2019   ALKPHOS 73 12/18/2019   BILITOT 0.7 12/18/2019   GFRNONAA >60 12/18/2019   GFRAA >60 12/18/2019    Lab Results  Component Value Date   WBC 6.6 07/09/2022   NEUTROABS 4.6 07/09/2022   HGB 14.0 07/09/2022   HCT 40.0 07/09/2022   MCV 88.5 07/09/2022   PLT 469 (H) 07/09/2022   Lab Results  Component Value Date   IRON 95 03/27/2022   TIBC 518 (H) 03/27/2022   IRONPCTSAT 18 03/27/2022   Lab Results  Component Value Date   FERRITIN 79 03/27/2022     STUDIES: No results found.  ASSESSMENT: Iron deficiency anemia.  PLAN:    1. Iron deficiency anemia: Patient's hemoglobin is within normal limits at 14.0.  Iron stores are pending at time of dictation.  Colonoscopy and EGD in November 2021 were reported as normal with no significant pathology.  Patient was noted to have mild gastritis.  No intervention is needed at this time.  Patient last received 200 mg IV Venofer on February 19, 2022.  Return to clinic in 4 months with repeat laboratory, further evaluation, and consideration of additional treatment if needed.  2.  Thrombocytosis: Chronic and  unchanged.  Monitor.  3.  Abdominal pain/early satiety: Continue work-up per GI.  Patient reports he has been given a referral to [facility].    Patient expressed understanding and was in agreement with this plan. He also understands that He can call clinic at any time with any questions, concerns, or complaints.     Lloyd Huger, MD   07/09/2022 3:34 PM

## 2022-07-09 ENCOUNTER — Encounter: Payer: Self-pay | Admitting: Oncology

## 2022-07-09 ENCOUNTER — Inpatient Hospital Stay: Payer: Managed Care, Other (non HMO) | Admitting: Oncology

## 2022-07-09 ENCOUNTER — Inpatient Hospital Stay: Payer: Managed Care, Other (non HMO)

## 2022-07-09 ENCOUNTER — Ambulatory Visit: Payer: Managed Care, Other (non HMO) | Admitting: Oncology

## 2022-07-09 ENCOUNTER — Other Ambulatory Visit: Payer: Managed Care, Other (non HMO)

## 2022-07-09 ENCOUNTER — Inpatient Hospital Stay: Payer: Managed Care, Other (non HMO) | Attending: Oncology

## 2022-07-09 VITALS — BP 121/77 | HR 95 | Temp 99.4°F | Resp 16 | Ht 67.0 in | Wt 136.5 lb

## 2022-07-09 DIAGNOSIS — D75839 Thrombocytosis, unspecified: Secondary | ICD-10-CM | POA: Diagnosis not present

## 2022-07-09 DIAGNOSIS — R6881 Early satiety: Secondary | ICD-10-CM | POA: Diagnosis not present

## 2022-07-09 DIAGNOSIS — D509 Iron deficiency anemia, unspecified: Secondary | ICD-10-CM | POA: Diagnosis present

## 2022-07-09 DIAGNOSIS — R109 Unspecified abdominal pain: Secondary | ICD-10-CM | POA: Insufficient documentation

## 2022-07-09 DIAGNOSIS — Z79899 Other long term (current) drug therapy: Secondary | ICD-10-CM | POA: Diagnosis not present

## 2022-07-09 DIAGNOSIS — R630 Anorexia: Secondary | ICD-10-CM | POA: Diagnosis not present

## 2022-07-09 DIAGNOSIS — E538 Deficiency of other specified B group vitamins: Secondary | ICD-10-CM | POA: Diagnosis present

## 2022-07-09 LAB — CBC WITH DIFFERENTIAL/PLATELET
Abs Immature Granulocytes: 0.01 10*3/uL (ref 0.00–0.07)
Basophils Absolute: 0 10*3/uL (ref 0.0–0.1)
Basophils Relative: 0 %
Eosinophils Absolute: 0.2 10*3/uL (ref 0.0–0.5)
Eosinophils Relative: 3 %
HCT: 40 % (ref 39.0–52.0)
Hemoglobin: 14 g/dL (ref 13.0–17.0)
Immature Granulocytes: 0 %
Lymphocytes Relative: 22 %
Lymphs Abs: 1.5 10*3/uL (ref 0.7–4.0)
MCH: 31 pg (ref 26.0–34.0)
MCHC: 35 g/dL (ref 30.0–36.0)
MCV: 88.5 fL (ref 80.0–100.0)
Monocytes Absolute: 0.3 10*3/uL (ref 0.1–1.0)
Monocytes Relative: 5 %
Neutro Abs: 4.6 10*3/uL (ref 1.7–7.7)
Neutrophils Relative %: 70 %
Platelets: 469 10*3/uL — ABNORMAL HIGH (ref 150–400)
RBC: 4.52 MIL/uL (ref 4.22–5.81)
RDW: 12.8 % (ref 11.5–15.5)
WBC: 6.6 10*3/uL (ref 4.0–10.5)
nRBC: 0 % (ref 0.0–0.2)

## 2022-07-09 LAB — IRON AND TIBC
Iron: 141 ug/dL (ref 45–182)
Saturation Ratios: 26 % (ref 17.9–39.5)
TIBC: 547 ug/dL — ABNORMAL HIGH (ref 250–450)
UIBC: 406 ug/dL

## 2022-07-09 LAB — FERRITIN: Ferritin: 19 ng/mL — ABNORMAL LOW (ref 24–336)

## 2022-10-05 ENCOUNTER — Other Ambulatory Visit: Payer: Self-pay | Admitting: Otolaryngology

## 2022-10-05 DIAGNOSIS — R42 Dizziness and giddiness: Secondary | ICD-10-CM

## 2022-10-07 ENCOUNTER — Other Ambulatory Visit: Payer: Self-pay | Admitting: Otolaryngology

## 2022-10-07 DIAGNOSIS — R42 Dizziness and giddiness: Secondary | ICD-10-CM

## 2022-10-08 ENCOUNTER — Ambulatory Visit
Admission: RE | Admit: 2022-10-08 | Discharge: 2022-10-08 | Disposition: A | Discharge status: Home / Self Care | Payer: Managed Care, Other (non HMO) | Source: Ambulatory Visit | Attending: Otolaryngology | Admitting: Otolaryngology

## 2022-10-08 DIAGNOSIS — R42 Dizziness and giddiness: Secondary | ICD-10-CM

## 2022-10-21 ENCOUNTER — Other Ambulatory Visit: Payer: Managed Care, Other (non HMO)

## 2022-10-22 ENCOUNTER — Ambulatory Visit: Payer: Managed Care, Other (non HMO) | Admitting: Oncology

## 2022-10-22 ENCOUNTER — Ambulatory Visit: Payer: Managed Care, Other (non HMO)

## 2022-10-27 ENCOUNTER — Encounter (INDEPENDENT_AMBULATORY_CARE_PROVIDER_SITE_OTHER): Payer: Self-pay | Admitting: Nurse Practitioner

## 2022-10-27 ENCOUNTER — Ambulatory Visit (INDEPENDENT_AMBULATORY_CARE_PROVIDER_SITE_OTHER): Payer: Managed Care, Other (non HMO) | Admitting: Nurse Practitioner

## 2022-10-27 VITALS — BP 143/89 | HR 78 | Resp 16 | Wt 136.6 lb

## 2022-10-27 DIAGNOSIS — I6523 Occlusion and stenosis of bilateral carotid arteries: Secondary | ICD-10-CM

## 2022-10-27 DIAGNOSIS — E785 Hyperlipidemia, unspecified: Secondary | ICD-10-CM | POA: Diagnosis not present

## 2022-10-27 DIAGNOSIS — R42 Dizziness and giddiness: Secondary | ICD-10-CM

## 2022-10-27 NOTE — Progress Notes (Signed)
Subjective:    Patient ID: Ryan Bonilla, male    DOB: 1955/12/19, 67 y.o.   MRN: [id number] Chief Complaint  Patient presents with   New Patient (Initial Visit)    Ref [facility] consult dizziness carotid u/s in epic    Ryan Bonilla is a 67 year old male who presents today as a referral after recent carotid duplex shows some moderate stenosis of his bilateral internal carotid arteries.  What prompted the duplex was dizziness.  The patient woke up abruptly 1 evening with severe dizziness with the room spinning.  When he tried to stand up he began to fall and subsequently had some severe nausea.  This ultimately resolved short while later.  Since that time he has not had any more significant dizzy spells however he does note that he has some shortness of breath with activity and recently while on a treadmill he began to have a significant headache in the occipital area.  He currently denies any claudication-like symptoms.  He denies any rest pain.  The patient himself has not had any history of heart attack or stroke but he does have a very significant family history of both heart attack and stroke.  The patient underwent a carotid duplex on 10/08/2022 which showed moderate and calcified plaque in the bilateral internal carotid arteries.  The vertebrals have antegrade flow with low resistance waveforms bilaterally.    Review of Systems  Respiratory:  Positive for shortness of breath (with exertion).   Neurological:  Positive for dizziness and headaches.  All other systems reviewed and are negative.      Objective:   Physical Exam Vitals reviewed.  HENT:     Head: Normocephalic.  Cardiovascular:     Rate and Rhythm: Normal rate.     Pulses:          Radial pulses are 2+ on the right side and 2+ on the left side.  Pulmonary:     Effort: Pulmonary effort is normal.  Skin:    General: Skin is warm and dry.  Neurological:     Mental Status: He is oriented to person, place, and time.   Psychiatric:        Mood and Affect: Mood normal.        Behavior: Behavior normal.        Thought Content: Thought content normal.        Judgment: Judgment normal.     BP (!) 143/89 (BP Location: Left Arm)   Pulse 78   Resp 16   Wt 136 lb 9.6 oz (62 kg)   BMI 21.39 kg/m   Past Medical History:  Diagnosis Date   Esophageal stricture    Hyperlipidemia    Hypertension    Kidney stones     Social History   Socioeconomic History   Marital status: Married    Spouse name: Not on file   Number of children: Not on file   Years of education: Not on file   Highest education level: Not on file  Occupational History   Not on file  Tobacco Use   Smoking status: Never   Smokeless tobacco: Never  Vaping Use   Vaping Use: Not on file  Substance and Sexual Activity   Alcohol use: Not Currently    Alcohol/week: 0.0 standard drinks of alcohol   Drug use: Not Currently   Sexual activity: Not on file  Other Topics Concern   Not on file  Social History Narrative   Not on  file   Social Determinants of Health   Financial Resource Strain: Not on file  Food Insecurity: Not on file  Transportation Needs: Not on file  Physical Activity: Not on file  Stress: Not on file  Social Connections: Not on file  Intimate Partner Violence: Not on file    Past Surgical History:  Procedure Laterality Date   COLONOSCOPY     COLONOSCOPY WITH PROPOFOL N/A 10/10/2015   Procedure: COLONOSCOPY WITH PROPOFOL;  Surgeon: Hulen Luster, MD;  Location: [facility] ENDOSCOPY;  Service: Gastroenterology;  Laterality: N/A;   ESOPHAGOGASTRODUODENOSCOPY     ESOPHAGOGASTRODUODENOSCOPY (EGD) WITH PROPOFOL N/A 10/10/2015   Procedure: ESOPHAGOGASTRODUODENOSCOPY (EGD) WITH PROPOFOL;  Surgeon: Hulen Luster, MD;  Location[facility] ENDOSCOPY;  Service: Gastroenterology;  Laterality: N/A;    Family History  Problem Relation Age of Onset   Hypertension Father     Allergies  Allergen Reactions   Penicillins         Latest Ref Rng & Units 07/09/2022    2:01 PM 03/27/2022    8:06 AM 01/29/2022   10:52 AM  CBC  WBC 4.0 - 10.5 K/uL 6.6  13.4  8.3   Hemoglobin 13.0 - 17.0 g/dL 14.0  17.0  13.8   Hematocrit 39.0 - 52.0 % 40.0  49.5  40.9   Platelets 150 - 400 K/uL 469  528  462       CMP     Component Value Date/Time   NA 138 12/18/2019 1654   NA 139 10/20/2017 0827   K 3.9 12/18/2019 1654   CL 104 12/18/2019 1654   CO2 28 12/18/2019 1654   GLUCOSE 70 03/12/2021 1312   GLUCOSE 149 (H) 12/18/2019 1654   BUN 17 12/18/2019 1654   BUN 18 10/20/2017 0827   CREATININE 0.93 12/18/2019 1654   CALCIUM 9.5 12/18/2019 1654   PROT 7.9 12/18/2019 1654   PROT 6.9 10/20/2017 0827   ALBUMIN 4.3 12/18/2019 1654   ALBUMIN 4.7 10/20/2017 0827   AST 18 12/18/2019 1654   ALT 18 12/18/2019 1654   ALKPHOS 73 12/18/2019 1654   BILITOT 0.7 12/18/2019 1654   BILITOT <0.2 10/20/2017 0827   GFRNONAA >60 12/18/2019 1654   GFRAA >60 12/18/2019 1654     No results found.     Assessment & Plan:   1. Bilateral carotid artery stenosis The patient's previous duplex notes moderate stenosis with calcified plaque.  Based upon the velocities it would indicate a stenosis of possibly 50% or less however patients with calcified plaque can sometimes have velocities that are underestimated on ultrasound.  Given the patient's symptoms of dizziness as well as his significant family history of atherosclerosis, we will have the patient undergo CT angiogram to evaluate if there is any evidence of noted steal syndrome and or a greater level of stenosis than what was estimated on ultrasound.  Will have the patient follow-up post study. - CT ANGIO NECK W OR WO CONTRAST; Future  2. Dizziness Based upon the patient's symptom pattern I suspect the dizziness was more so related to vertigo however we will evaluate more closely as detailed above with a CT angiogram  3. Hyperlipidemia, unspecified hyperlipidemia type Continue statin as  ordered and reviewed, no changes at this time   Current Outpatient Medications on File Prior to Visit  Medication Sig Dispense Refill   atorvastatin (LIPITOR) 10 MG tablet Take 10 mg by mouth daily.     cyanocobalamin (,VITAMIN B-12,) 1000 MCG/ML injection Inject 1 ml (1000  units) every 30 days 3 mL 3   Cyanocobalamin 1000 MCG/ML KIT Inject 1 Dose as directed every 30 (thirty) days. 1 kit 12   fluticasone (FLONASE) 50 MCG/ACT nasal spray USE 2 SPRAYS IN EACH  NOSTRIL DAILY (Patient taking differently: Place 2 sprays into both nostrils daily.) 48 g 3   gemfibrozil (LOPID) 600 MG tablet Take 600 mg by mouth 2 (two) times daily before a meal.     lisinopril (ZESTRIL) 20 MG tablet Take 20 mg by mouth daily.     Syringe/Needle, Disp, (SYRINGE 3CC/25GX1") 25G X 1" 3 ML MISC Use to inject Cyanocobalamin every 30 days 3 each 3   No current facility-administered medications on file prior to visit.    There are no Patient Instructions on file for this visit. No follow-ups on file.   Kris Hartmann, NP

## 2022-11-03 ENCOUNTER — Other Ambulatory Visit: Payer: Managed Care, Other (non HMO)

## 2022-11-09 ENCOUNTER — Ambulatory Visit: Admission: RE | Admit: 2022-11-09 | Payer: Managed Care, Other (non HMO) | Source: Ambulatory Visit

## 2022-11-10 ENCOUNTER — Ambulatory Visit
Admission: RE | Admit: 2022-11-10 | Discharge: 2022-11-10 | Disposition: A | Discharge status: Home / Self Care | Payer: Managed Care, Other (non HMO) | Source: Ambulatory Visit | Attending: Otolaryngology | Admitting: Otolaryngology

## 2022-11-10 ENCOUNTER — Telehealth (INDEPENDENT_AMBULATORY_CARE_PROVIDER_SITE_OTHER): Payer: Self-pay | Admitting: Nurse Practitioner

## 2022-11-10 DIAGNOSIS — R42 Dizziness and giddiness: Secondary | ICD-10-CM

## 2022-11-10 MED ORDER — GADOBENATE DIMEGLUMINE 529 MG/ML IV SOLN
12.0000 mL | Freq: Once | INTRAVENOUS | Status: AC | PRN
  Administered 2022-11-10: 12 mL via INTRAVENOUS

## 2022-11-10 NOTE — Telephone Encounter (Signed)
Patient cancelled his CT appt due to having the flu. I advised pt that when he called to make his new appt for the CT, to call us here at [facility] to make a CT results appt with either Dr. Delana Meyer or Dr. Lucky Cowboy. Pt acknowledged and states that he is going to try and call this afternoon.

## 2022-11-18 ENCOUNTER — Ambulatory Visit: Payer: Managed Care, Other (non HMO)

## 2022-11-19 ENCOUNTER — Ambulatory Visit
Admission: RE | Admit: 2022-11-19 | Discharge: 2022-11-19 | Disposition: A | Discharge status: Home / Self Care | Payer: Managed Care, Other (non HMO) | Source: Ambulatory Visit | Attending: Nurse Practitioner | Admitting: Nurse Practitioner

## 2022-11-19 ENCOUNTER — Ambulatory Visit (INDEPENDENT_AMBULATORY_CARE_PROVIDER_SITE_OTHER): Payer: Managed Care, Other (non HMO) | Admitting: Vascular Surgery

## 2022-11-19 DIAGNOSIS — I6523 Occlusion and stenosis of bilateral carotid arteries: Secondary | ICD-10-CM

## 2022-11-19 MED ORDER — IOHEXOL 350 MG/ML SOLN
75.0000 mL | Freq: Once | INTRAVENOUS | Status: AC | PRN
  Administered 2022-11-19: 75 mL via INTRAVENOUS

## 2022-11-22 DIAGNOSIS — I6529 Occlusion and stenosis of unspecified carotid artery: Secondary | ICD-10-CM | POA: Insufficient documentation

## 2022-11-22 DIAGNOSIS — R55 Syncope and collapse: Secondary | ICD-10-CM | POA: Insufficient documentation

## 2022-11-22 NOTE — Progress Notes (Signed)
Lung: When you get out that leg gradually was up there is reason why leave the house for 3 weeks so she went on yesterday and stayed overnight beautiful     MRN : [id number]  Ryan Bonilla is a 67 y.o. (12-18-1955) male who presents with chief complaint of check carotid arteries.  History of Present Illness:   The patient is seen for follow up evaluation of carotid stenosis. The carotid stenosis was identified after he experienced a syncopal episode.  This led to a carotid ultrasound which subsequently led to a CT angio.    The patient denies amaurosis fugax. There is no recent history of TIA symptoms or focal motor deficits. There is no prior documented CVA.  The patient is taking enteric-coated aspirin 81 mg daily.  There is no history of migraine headaches. There is no history of seizures.  No recent shortening of the patient's walking distance or new symptoms consistent with claudication.  No history of rest pain symptoms. No new ulcers or wounds of the lower extremities have occurred.  There is no history of DVT, PE or superficial thrombophlebitis. No recent episodes of angina or shortness of breath documented.   Previous carotid duplex showed <40% bilateral ICA stenosis.    CT angiogram dated 11/19/2022 shows mixed plaque of the carotid bifurcations resulting in approximately 50% stenosis on the right and 15% stenosis on the left.  No outpatient medications have been marked as taking for the 11/23/22 encounter (Appointment) with Gilda Crease, Latina Craver, MD.    Past Medical History:  Diagnosis Date   Esophageal stricture    Hyperlipidemia    Hypertension    Kidney stones     Past Surgical History:  Procedure Laterality Date   COLONOSCOPY     COLONOSCOPY WITH PROPOFOL N/A 10/10/2015   Procedure: COLONOSCOPY WITH PROPOFOL;  Surgeon: Wallace Cullens, MD;  Location: [facility] ENDOSCOPY;  Service: Gastroenterology;  Laterality: N/A;   ESOPHAGOGASTRODUODENOSCOPY      ESOPHAGOGASTRODUODENOSCOPY (EGD) WITH PROPOFOL N/A 10/10/2015   Procedure: ESOPHAGOGASTRODUODENOSCOPY (EGD) WITH PROPOFOL;  Surgeon: Wallace Cullens, MD;  Location: [facility] ENDOSCOPY;  Service: Gastroenterology;  Laterality: N/A;    Social History Social History   Tobacco Use   Smoking status: Never   Smokeless tobacco: Never  Substance Use Topics   Alcohol use: Not Currently    Alcohol/week: 0.0 standard drinks of alcohol   Drug use: Not Currently    Family History Family History  Problem Relation Age of Onset   Hypertension Father     Allergies  Allergen Reactions   Penicillins      REVIEW OF SYSTEMS (Negative unless checked)  Constitutional: [] Weight loss  [] Fever  [] Chills Cardiac: [] Chest pain   [] Chest pressure   [] Palpitations   [] Shortness of breath when laying flat   [] Shortness of breath with exertion. Vascular:  [x] Pain in legs with walking   [] Pain in legs at rest  [] History of DVT   [] Phlebitis   [] Swelling in legs   [] Varicose veins   [] Non-healing ulcers Pulmonary:   [] Uses home oxygen   [] Productive cough   [] Hemoptysis   [] Wheeze  [] COPD   [] Asthma Neurologic:  [] Dizziness   [] Seizures   [] History of stroke   [] History of TIA  [] Aphasia   [] Vissual changes   [] Weakness or numbness in arm   [] Weakness or numbness in leg Musculoskeletal:   [] Joint swelling   [] Joint pain   [] Low back pain Hematologic:  [] Easy bruising  [] Easy bleeding   [] Hypercoagulable  state   [] Anemic Gastrointestinal:  [] Diarrhea   [] Vomiting  [] Gastroesophageal reflux/heartburn   [] Difficulty swallowing. Genitourinary:  [] Chronic kidney disease   [] Difficult urination  [] Frequent urination   [] Blood in urine Skin:  [] Rashes   [] Ulcers  Psychological:  [] History of anxiety   []  History of major depression.  Physical Examination  There were no vitals filed for this visit. There is no height or weight on file to calculate BMI. Gen: WD/WN, NAD Head: /AT, No temporalis wasting.   Ear/Nose/Throat: Hearing grossly intact, nares w/o erythema or drainage Eyes: PER, EOMI, sclera nonicteric.  Neck: Supple, no masses.  No bruit or JVD.  Pulmonary:  Good air movement, no audible wheezing, no use of accessory muscles.  Cardiac: RRR, normal S1, S2, no Murmurs. Vascular:  carotid bruit noted Vessel Right Left  Radial Palpable Palpable  Carotid  Palpable  Palpable  Gastrointestinal: soft, non-distended. No guarding/no peritoneal signs.  Musculoskeletal: M/S 5/5 throughout.  No visible deformity.  Neurologic: CN 2-12 intact. Pain and light touch intact in extremities.  Symmetrical.  Speech is fluent. Motor exam as listed above. Psychiatric: Judgment intact, Mood & affect appropriate for pt's clinical situation. Dermatologic: No rashes or ulcers noted.  No changes consistent with cellulitis.   CBC Lab Results  Component Value Date   WBC 6.6 07/09/2022   HGB 14.0 07/09/2022   HCT 40.0 07/09/2022   MCV 88.5 07/09/2022   PLT 469 (H) 07/09/2022    BMET    Component Value Date/Time   NA 138 12/18/2019 1654   NA 139 10/20/2017 0827   K 3.9 12/18/2019 1654   CL 104 12/18/2019 1654   CO2 28 12/18/2019 1654   GLUCOSE 70 03/12/2021 1312   GLUCOSE 149 (H) 12/18/2019 1654   BUN 17 12/18/2019 1654   BUN 18 10/20/2017 0827   CREATININE 0.93 12/18/2019 1654   CALCIUM 9.5 12/18/2019 1654   GFRNONAA >60 12/18/2019 1654   GFRAA >60 12/18/2019 1654   CrCl cannot be calculated (Patient's most recent lab result is older than the maximum 21 days allowed.).  COAG No results found for: "INR", "PROTIME"  Radiology CT ANGIO NECK W OR WO CONTRAST  Result Date: 11/20/2022 CLINICAL DATA:  Carotid stenosis seen on ultrasound EXAM: CT ANGIOGRAPHY NECK TECHNIQUE: Multidetector CT imaging of the neck was performed using the standard protocol during bolus administration of intravenous contrast. Multiplanar CT image reconstructions and MIPs were obtained to evaluate the vascular anatomy.  Carotid stenosis measurements (when applicable) are obtained utilizing NASCET criteria, using the distal internal carotid diameter as the denominator. RADIATION DOSE REDUCTION: This exam was performed according to the departmental dose-optimization program which includes automated exposure control, adjustment of the mA and/or kV according to patient size and/or use of iterative reconstruction technique. CONTRAST:  67mL OMNIPAQUE IOHEXOL 350 MG/ML SOLN COMPARISON:  Doppler ultrasound 10/08/2022 FINDINGS: Aortic arch: There is mild calcified plaque in the aortic arch. The origins of the major branch vessels are patent. The subclavian arteries are patent to the level imaged with a small focus of plaque on the left just proximal to the vertebral artery artery origin. Right carotid system: The right common carotid artery is patent. There is soft and calcified plaque at the bifurcation and proximal internal carotid artery resulting in up to approximately 30% stenosis. The distal internal carotid artery is widely patent. The external carotid artery is patent. There is no evidence of dissection or aneurysm. Left carotid system: The left common carotid artery is patent. There is  soft and calcified plaque in the proximal left internal carotid artery resulting in up to approximately 15% stenosis. The distal internal carotid artery is widely patent. The external carotid artery is patent. There is no evidence of dissection or aneurysm. Vertebral arteries: The vertebral arteries are patent, without hemodynamically significant stenosis or occlusion. There is no evidence of dissection or aneurysm. There is a prominent fenestration in the left V3 segment. There is a small focus of calcified plaque in the left V4 segment without significant stenosis. Skeleton: There is no acute osseous abnormality or suspicious osseous lesion. Other neck: The soft tissues of the neck are unremarkable. Upper chest: The imaged lung apices are clear.  IMPRESSION: 1. Mixed plaque of the carotid bifurcations resulting in approximately 30% stenosis on the right and 15% stenosis on the left. 2. Widely patent vertebral and subclavian arteries. Electronically Signed   By: Valetta Mole M.D.   On: 11/20/2022 09:19   MR BRAIN/IAC W WO CONTRAST  Result Date: 11/10/2022 CLINICAL DATA:  Dizziness.  One episode of vertigo 4-6 weeks ago. EXAM: MRI HEAD WITHOUT AND WITH CONTRAST TECHNIQUE: Multiplanar, multiecho pulse sequences of the brain and surrounding structures were obtained without and with intravenous contrast. CONTRAST:  70mL MULTIHANCE GADOBENATE DIMEGLUMINE 529 MG/ML IV SOLN COMPARISON:  None Available. FINDINGS: Brain: No acute infarct or hemorrhage. Patchy T2 hyperintensity of the cerebral white matter, most consistent with chronic small vessel disease. No mass or abnormal enhancement. No midline shift. No hydrocephalus, extra-axial collection. Basilar cisterns are patent. Dedicated imaging through the internal auditory canals demonstrates a normal course of cranial nerves VII and VIII without evidence of a mass or abnormal enhancement. The inner ear structures demonstrate normal signal and morphology bilaterally. No mass is present in the cerebellopontine angles. Vascular: Normal flow voids. Skull and upper cervical spine: Normal marrow signal and enhancement. Sinuses/Orbits: Mild mucosal disease in the anterior ethmoid air cells. Mastoids and middle ear cavities are clear. Orbits are unremarkable. Other: None. IMPRESSION: 1. No mass or abnormal enhancement of the internal auditory canals or cerebellopontine angle cisterns. 2. Moderate chronic small vessel disease. Electronically Signed   By: Emmit Alexanders M.D   On: 11/10/2022 11:56     Assessment/Plan 1. Syncope, unspecified syncope type It would seem very unlikely based on his mild to moderate atherosclerotic occlusive disease of the carotids that his syncope was a vascular origin or etiology.  I  believe his workup should continue and have placed an ambulatory consult for cardiology to see him. - Ambulatory referral to Cardiology  2. Bilateral carotid artery stenosis Recommend:  Given the patient's asymptomatic subcritical stenosis no further invasive testing or surgery at this time.  Duplex ultrasound shows <50% stenosis bilaterally.  Continue antiplatelet therapy as prescribed Continue management of CAD, HTN and Hyperlipidemia Healthy heart diet,  encouraged exercise at least 4 times per week Follow up in 12 months with duplex ultrasound and physical exam  - VAS US CAROTID; Future  3. Primary hypertension Continue antihypertensive medications as already ordered, these medications have been reviewed and there are no changes at this time.  4. Hyperlipidemia, unspecified hyperlipidemia type Continue statin as ordered and reviewed, no changes at this time - Ambulatory referral to Cardiology    Hortencia Pilar, MD  11/22/2022 9:59 AM

## 2022-11-23 ENCOUNTER — Encounter (INDEPENDENT_AMBULATORY_CARE_PROVIDER_SITE_OTHER): Payer: Self-pay | Admitting: Vascular Surgery

## 2022-11-23 ENCOUNTER — Ambulatory Visit (INDEPENDENT_AMBULATORY_CARE_PROVIDER_SITE_OTHER): Payer: Managed Care, Other (non HMO) | Admitting: Vascular Surgery

## 2022-11-23 VITALS — BP 122/76 | HR 74 | Resp 16 | Wt 133.0 lb

## 2022-11-23 DIAGNOSIS — R55 Syncope and collapse: Secondary | ICD-10-CM

## 2022-11-23 DIAGNOSIS — E785 Hyperlipidemia, unspecified: Secondary | ICD-10-CM

## 2022-11-23 DIAGNOSIS — I10 Essential (primary) hypertension: Secondary | ICD-10-CM

## 2022-11-23 DIAGNOSIS — I6523 Occlusion and stenosis of bilateral carotid arteries: Secondary | ICD-10-CM | POA: Diagnosis not present

## 2022-11-27 ENCOUNTER — Encounter (INDEPENDENT_AMBULATORY_CARE_PROVIDER_SITE_OTHER): Payer: Self-pay | Admitting: Vascular Surgery

## 2023-02-08 ENCOUNTER — Other Ambulatory Visit: Payer: Self-pay | Admitting: Cardiovascular Disease

## 2023-02-08 DIAGNOSIS — R9439 Abnormal result of other cardiovascular function study: Secondary | ICD-10-CM

## 2023-02-08 DIAGNOSIS — R0602 Shortness of breath: Secondary | ICD-10-CM

## 2023-02-08 DIAGNOSIS — I700 Atherosclerosis of aorta: Secondary | ICD-10-CM

## 2023-02-08 DIAGNOSIS — I6523 Occlusion and stenosis of bilateral carotid arteries: Secondary | ICD-10-CM

## 2023-02-08 DIAGNOSIS — R0609 Other forms of dyspnea: Secondary | ICD-10-CM

## 2023-02-17 ENCOUNTER — Encounter: Payer: Self-pay | Admitting: Oncology

## 2023-02-17 ENCOUNTER — Telehealth (HOSPITAL_COMMUNITY): Payer: Self-pay | Admitting: *Deleted

## 2023-02-17 MED ORDER — METOPROLOL TARTRATE 100 MG PO TABS: ORAL_TABLET | ORAL | 0 refills | Status: DC

## 2023-02-17 NOTE — Telephone Encounter (Signed)
Reaching out to patient to offer assistance regarding upcoming cardiac imaging study; pt verbalizes understanding of appt date/time, parking situation and where to check in, pre-test NPO status and medications ordered, and verified current allergies; name and call back number provided for further questions should they arise  Larey Brick RN Navigator Cardiac Imaging [facility] [phone] office [phone] cell  Patient to take  metoprolol tartrate two hours prior two hours prior to his cardiac CT scan.

## 2023-02-18 ENCOUNTER — Encounter: Payer: Self-pay | Admitting: Oncology

## 2023-02-18 ENCOUNTER — Other Ambulatory Visit: Payer: Self-pay | Admitting: Cardiovascular Disease

## 2023-02-18 ENCOUNTER — Ambulatory Visit
Admission: RE | Admit: 2023-02-18 | Discharge: 2023-02-18 | Disposition: A | Discharge status: Home / Self Care | Payer: Medicare HMO | Source: Ambulatory Visit | Attending: Cardiovascular Disease | Admitting: Cardiovascular Disease

## 2023-02-18 DIAGNOSIS — R0602 Shortness of breath: Secondary | ICD-10-CM | POA: Diagnosis present

## 2023-02-18 DIAGNOSIS — I2510 Atherosclerotic heart disease of native coronary artery without angina pectoris: Secondary | ICD-10-CM

## 2023-02-18 DIAGNOSIS — R9439 Abnormal result of other cardiovascular function study: Secondary | ICD-10-CM

## 2023-02-18 DIAGNOSIS — R0609 Other forms of dyspnea: Secondary | ICD-10-CM

## 2023-02-18 DIAGNOSIS — R931 Abnormal findings on diagnostic imaging of heart and coronary circulation: Secondary | ICD-10-CM | POA: Diagnosis not present

## 2023-02-18 DIAGNOSIS — I6523 Occlusion and stenosis of bilateral carotid arteries: Secondary | ICD-10-CM

## 2023-02-18 DIAGNOSIS — I700 Atherosclerosis of aorta: Secondary | ICD-10-CM

## 2023-02-18 MED ORDER — NITROGLYCERIN 0.4 MG SL SUBL
0.8000 mg | SUBLINGUAL_TABLET | Freq: Once | SUBLINGUAL | Status: AC
  Administered 2023-02-18: 0.8 mg via SUBLINGUAL

## 2023-02-18 MED ORDER — IOHEXOL 350 MG/ML SOLN
75.0000 mL | Freq: Once | INTRAVENOUS | Status: AC | PRN
  Administered 2023-02-18: 75 mL via INTRAVENOUS

## 2023-02-18 MED ORDER — METOPROLOL TARTRATE 5 MG/5ML IV SOLN
5.0000 mg | Freq: Once | INTRAVENOUS | Status: AC
  Administered 2023-02-18: 5 mg via INTRAVENOUS

## 2023-02-18 NOTE — Progress Notes (Signed)
Patient tolerated procedure well. Ambulate w/o difficulty. Denies any lightheadedness or being dizzy. Pt denies any pain at this time. Sitting in chair, pt is encouraged to drink additional water throughout the day and reason explained to patient. Patient verbalized understanding and all questions answered. ABC intact. No further needs at this time. Discharge from procedure area w/o issues.  

## 2023-03-01 ENCOUNTER — Ambulatory Visit
Admission: RE | Admit: 2023-03-01 | Discharge: 2023-03-01 | Disposition: A | Discharge status: Home / Self Care | Payer: Medicare HMO | Source: Ambulatory Visit | Attending: Cardiovascular Disease | Admitting: Cardiovascular Disease

## 2023-03-01 ENCOUNTER — Other Ambulatory Visit: Payer: Self-pay | Admitting: Cardiovascular Disease

## 2023-03-01 DIAGNOSIS — R590 Localized enlarged lymph nodes: Secondary | ICD-10-CM | POA: Diagnosis not present

## 2023-03-01 DIAGNOSIS — R0602 Shortness of breath: Secondary | ICD-10-CM

## 2023-03-01 DIAGNOSIS — R0609 Other forms of dyspnea: Secondary | ICD-10-CM

## 2023-03-01 MED ORDER — IOHEXOL 300 MG/ML  SOLN
75.0000 mL | Freq: Once | INTRAMUSCULAR | Status: AC | PRN
  Administered 2023-03-01: 75 mL via INTRAVENOUS

## 2023-03-04 ENCOUNTER — Ambulatory Visit: Admission: RE | Admit: 2023-03-04 | Payer: Managed Care, Other (non HMO) | Source: Ambulatory Visit

## 2023-04-23 ENCOUNTER — Telehealth: Payer: Self-pay | Admitting: *Deleted

## 2023-04-23 NOTE — Telephone Encounter (Signed)
[facility] sent rf request by fax for patient's b12. Pt has not been seen since last fall and no showed/cnl in December due to financial concerns. He has no follow-up in our office. See mychart msg to patient below by [facility] RN

## 2023-05-05 ENCOUNTER — Telehealth: Payer: Self-pay | Admitting: *Deleted

## 2023-05-05 NOTE — Telephone Encounter (Signed)
Patient called reporting that he was instructed by PCP to callus to get an URGENT iron infusion today or tomorrow due to his labs being low. He was last seen in Sept 2023 and cancelled his December appointment due to billing issue. Please advise   Iron Panel Specimen: Blood Component Ref Range & Units Today  Iron 45 - 182 ug/dL 20 Low   Total Iron Binding Capacity (TIBC) 261.0 - 478.0 ug/dL 161.0 High   Transferrin 203.0 - 362.0 mg/dL 960.4 High   % Saturation % 3  Resulting Agency [facility]  Specimen Collected: 05/05/23 09:42   Performed by: [facility] Last Resulted: 05/05/23 12:01   Ferritin Specimen: Blood Component Ref Range & Units Today  Ferritin 23 - 336 ng/mL 3 Low   Resulting Agency [facility]  Specimen Collected: 05/05/23 09:42   Performed by: [facility] Last Resulted: 05/05/23 12:01  Received From: [facility]  Result Received: 05/05/23 12:21  ntains abnormal data CBC w/auto Differential (5 Part) Specimen: Blood Component Ref Range & Units Today  WBC (White Blood Cell Count) 4.1 - 10.2 10^3/uL 6.4  RBC (Red Blood Cell Count) 4.69 - 6.13 10^6/uL 3.74 Low   Hemoglobin 14.1 - 18.1 gm/dL 8.6 Low   Hematocrit 54.0 - 52.0 % 27.6 Low   MCV (Mean Corpuscular Volume) 80.0 - 100.0 fl 73.8 Low   MCH (Mean Corpuscular Hemoglobin) 27.0 - 31.2 pg 23.0 Low   MCHC (Mean Corpuscular Hemoglobin Concentration) 32.0 - 36.0 gm/dL 98.1 Low   Platelet Count 150 - 450 10^3/uL 558 High   RDW-CV (Red Cell Distribution Width) 11.6 - 14.8 % 14.4  MPV (Mean Platelet Volume) 9.4 - 12.4 fl 8.3 Low   Neutrophils 1.50 - 7.80 10^3/uL 4.37  Lymphocytes 1.00 - 3.60 10^3/uL 1.44  Monocytes 0.00 - 1.50 10^3/uL 0.42  Eosinophils 0.00 - 0.55 10^3/uL 0.10  Basophils 0.00 - 0.09 10^3/uL 0.03  Neutrophil % 32.0 - 70.0 % 68.5  Lymphocyte % 10.0 - 50.0 % 22.6  Monocyte % 4.0 - 13.0 % 6.6  Eosinophil  % 1.0 - 5.0 % 1.6  Basophil% 0.0 - 2.0 % 0.5  Immature Granulocyte % <=0.7 % 0.2  Immature Granulocyte Count <=0.06 10^3/L 0.01  Resulting Agency [facility]  Specimen Collected: 05/05/23 09:42   Performed by: [facility] Last Resulted: 05/05/23 10:14  Received From: [facility]  Result Received: 05/05/23 12:21

## 2023-05-05 NOTE — Telephone Encounter (Signed)
Call returned to patient and spoke with him re appointment timing and he was in agreement for Friday appointment

## 2023-05-07 ENCOUNTER — Other Ambulatory Visit: Payer: Self-pay | Admitting: Medical Oncology

## 2023-05-07 ENCOUNTER — Inpatient Hospital Stay: Payer: Medicare HMO

## 2023-05-07 ENCOUNTER — Encounter: Payer: Self-pay | Admitting: Oncology

## 2023-05-07 ENCOUNTER — Inpatient Hospital Stay: Payer: Medicare HMO | Attending: Oncology | Admitting: Oncology

## 2023-05-07 VITALS — BP 118/63 | HR 64 | Resp 18

## 2023-05-07 VITALS — BP 135/66 | HR 78 | Temp 97.4°F | Ht 67.0 in | Wt 133.5 lb

## 2023-05-07 DIAGNOSIS — D509 Iron deficiency anemia, unspecified: Secondary | ICD-10-CM | POA: Diagnosis present

## 2023-05-07 DIAGNOSIS — Z79899 Other long term (current) drug therapy: Secondary | ICD-10-CM | POA: Diagnosis not present

## 2023-05-07 DIAGNOSIS — Z7982 Long term (current) use of aspirin: Secondary | ICD-10-CM | POA: Diagnosis not present

## 2023-05-07 MED ORDER — SODIUM CHLORIDE 0.9 % IV SOLN
200.0000 mg | Freq: Once | INTRAVENOUS | Status: AC
  Administered 2023-05-07: 200 mg via INTRAVENOUS
  Filled 2023-05-07: qty 200

## 2023-05-07 MED ORDER — "SYRINGE 25G X 1"" 3 ML MISC": 3 refills | Status: AC

## 2023-05-07 MED ORDER — SODIUM CHLORIDE 0.9 % IV SOLN
INTRAVENOUS | Status: DC
  Filled 2023-05-07: qty 250

## 2023-05-07 MED ORDER — CYANOCOBALAMIN 1000 MCG/ML IJ KIT: 1.0000 | PACK | INTRAMUSCULAR | 12 refills | Status: AC

## 2023-05-07 NOTE — Progress Notes (Signed)
[facility]  [phone] [phone]  ID: Ryan Bonilla OB: Dec 26, 1955  MR#: [id number]  GEX#:[id number]  Patient Care Team: Gracelyn Nurse, MD as PCP - General (Internal Medicine) Jeralyn Ruths, MD as Consulting Physician (Hematology and Oncology)  CHIEF COMPLAINT: Iron deficiency anemia.  INTERVAL HISTORY: Patient last seen in clinic in September 2023.  He is referred to [place] by primary care for declining hemoglobin and iron stores.  Patient also states that he has had worsening weakness and fatigue over the past several months.  He has no neurologic complaints.  He denies any recent fevers or illnesses.  He has no chest pain, shortness of breath, cough, or hemoptysis.  He denies any nausea, vomiting, constipation, or diarrhea.  He denies any melena or hematochezia.  He has no urinary complaints.  Patient offers no further specific complaints today.  REVIEW OF SYSTEMS:   Review of Systems  Constitutional:  Positive for malaise/fatigue. Negative for fever and weight loss.  Respiratory: Negative.  Negative for cough, hemoptysis and shortness of breath.   Cardiovascular: Negative.  Negative for chest pain and leg swelling.  Gastrointestinal: Negative.  Negative for abdominal pain, blood in stool and melena.  Genitourinary: Negative.  Negative for hematuria.  Musculoskeletal: Negative.  Negative for back pain.  Skin: Negative.  Negative for rash.  Neurological:  Positive for weakness. Negative for dizziness, focal weakness and headaches.  Psychiatric/Behavioral: Negative.  The patient is not nervous/anxious.     As per HPI. Otherwise, a complete review of systems is negative.  PAST MEDICAL HISTORY: Past Medical History:  Diagnosis Date   Esophageal stricture    Hyperlipidemia    Hypertension    Kidney stones     PAST SURGICAL HISTORY: Past Surgical History:  Procedure Laterality Date   COLONOSCOPY     COLONOSCOPY WITH PROPOFOL N/A  10/10/2015   Procedure: COLONOSCOPY WITH PROPOFOL;  Surgeon: Wallace Cullens, MD;  Location: [facility] ENDOSCOPY;  Service: Gastroenterology;  Laterality: N/A;   ESOPHAGOGASTRODUODENOSCOPY     ESOPHAGOGASTRODUODENOSCOPY (EGD) WITH PROPOFOL N/A 10/10/2015   Procedure: ESOPHAGOGASTRODUODENOSCOPY (EGD) WITH PROPOFOL;  Surgeon: Wallace Cullens, MD;  Location: [facility] ENDOSCOPY;  Service: Gastroenterology;  Laterality: N/A;    FAMILY HISTORY: Family History  Problem Relation Age of Onset   Hypertension Father    Anemia Father     ADVANCED DIRECTIVES (Y/N):  N  HEALTH MAINTENANCE: Social History   Tobacco Use   Smoking status: Never   Smokeless tobacco: Never  Substance Use Topics   Alcohol use: Not Currently    Alcohol/week: 0.0 standard drinks of alcohol   Drug use: Not Currently     Colonoscopy:  PAP:  Bone density:  Lipid panel:  Allergies  Allergen Reactions   Penicillins     Current Outpatient Medications  Medication Sig Dispense Refill   aspirin EC 81 MG tablet Take 81 mg by mouth daily. Swallow whole.     atorvastatin (LIPITOR) 10 MG tablet Take 40 mg by mouth daily.     cyanocobalamin (VITAMIN B12) 1000 MCG tablet Take 1,000 mcg by mouth daily.     fluticasone (FLONASE) 50 MCG/ACT nasal spray USE 2 SPRAYS IN EACH  NOSTRIL DAILY 48 g 3   lisinopril (ZESTRIL) 20 MG tablet Take 20 mg by mouth daily.     cyanocobalamin (,VITAMIN B-12,) 1000 MCG/ML injection Inject 1 ml (1000 units) every 30 days (Patient not taking: Reported on 05/07/2023) 3 mL 3   Cyanocobalamin 1000 MCG/ML KIT  Inject 1 Dose as directed every 30 (thirty) days. (Patient not taking: Reported on 05/07/2023) 1 kit 12   gemfibrozil (LOPID) 600 MG tablet Take 600 mg by mouth 2 (two) times daily before a meal. (Patient not taking: Reported on 05/07/2023)     Syringe/Needle, Disp, (SYRINGE 3CC/25GX1") 25G X 1" 3 ML MISC Use to inject Cyanocobalamin every 30 days (Patient not taking: Reported on 05/07/2023) 3 each 3   No current  facility-administered medications for this visit.   Facility-Administered Medications Ordered in Other Visits  Medication Dose Route Frequency Provider Last Rate Last Admin   0.9 %  sodium chloride infusion   Intravenous Continuous Jeralyn Ruths, MD   Stopped at 05/07/23 1130    OBJECTIVE: Vitals:   05/07/23 0937  BP: 135/66  Pulse: 78  Temp: (!) 97.4 F (36.3 C)  SpO2: 100%     Body mass index is 20.91 kg/m.    ECOG FS:0 - Asymptomatic  General: Well-developed, well-nourished, no acute distress. Eyes: Pink conjunctiva, anicteric sclera. HEENT: Normocephalic, moist mucous membranes. Lungs: No audible wheezing or coughing. Heart: Regular rate and rhythm. Abdomen: Soft, nontender, no obvious distention. Musculoskeletal: No edema, cyanosis, or clubbing. Neuro: Alert, answering all questions appropriately. Cranial nerves grossly intact. Skin: No rashes or petechiae noted. Psych: Normal affect.  LAB RESULTS:  Lab Results  Component Value Date   NA 138 12/18/2019   K 3.9 12/18/2019   CL 104 12/18/2019   CO2 28 12/18/2019   GLUCOSE 70 03/12/2021   BUN 17 12/18/2019   CREATININE 0.93 12/18/2019   CALCIUM 9.5 12/18/2019   PROT 7.9 12/18/2019   ALBUMIN 4.3 12/18/2019   AST 18 12/18/2019   ALT 18 12/18/2019   ALKPHOS 73 12/18/2019   BILITOT 0.7 12/18/2019   GFRNONAA >60 12/18/2019   GFRAA >60 12/18/2019    Lab Results  Component Value Date   WBC 6.6 07/09/2022   NEUTROABS 4.6 07/09/2022   HGB 14.0 07/09/2022   HCT 40.0 07/09/2022   MCV 88.5 07/09/2022   PLT 469 (H) 07/09/2022   Lab Results  Component Value Date   IRON 141 07/09/2022   TIBC 547 (H) 07/09/2022   IRONPCTSAT 26 07/09/2022   Lab Results  Component Value Date   FERRITIN 19 (L) 07/09/2022     STUDIES: No results found.  ASSESSMENT: Iron deficiency anemia.  PLAN:    Iron deficiency anemia: Patient's most recent hemoglobin has trended down to 8.6 with a ferritin of 3, a total iron of  20, and an iron saturation of 3%.  He is also symptomatic. Colonoscopy and EGD in November 2021 were reported as normal with no significant pathology.  Proceed with 200 mg IV Venofer today.  Return to clinic 4 times over the next 2 weeks to receive additional treatments.  Patient will then return to clinic in 4 months with repeat laboratory work, further evaluation, and continuation of treatment if needed.    I spent a total of 30 minutes reviewing chart data, face-to-face evaluation with the patient, counseling and coordination of care as detailed above.    Patient expressed understanding and was in agreement with this plan. He also understands that He can call clinic at any time with any questions, concerns, or complaints.     Jeralyn Ruths, MD   05/07/2023 12:11 PM

## 2023-05-07 NOTE — Progress Notes (Signed)
Pt has been educated and understands. Pt declined to stay 30 mins after iron infusion. VSS.  

## 2023-05-12 ENCOUNTER — Inpatient Hospital Stay: Payer: Medicare HMO

## 2023-05-12 VITALS — BP 111/55 | HR 82 | Temp 98.3°F | Resp 16

## 2023-05-12 DIAGNOSIS — D509 Iron deficiency anemia, unspecified: Secondary | ICD-10-CM | POA: Diagnosis not present

## 2023-05-12 MED ORDER — SODIUM CHLORIDE 0.9 % IV SOLN
200.0000 mg | Freq: Once | INTRAVENOUS | Status: AC
  Administered 2023-05-12: 200 mg via INTRAVENOUS
  Filled 2023-05-12: qty 200

## 2023-05-12 MED ORDER — SODIUM CHLORIDE 0.9 % IV SOLN
INTRAVENOUS | Status: DC
  Filled 2023-05-12: qty 250

## 2023-05-12 NOTE — Patient Instructions (Signed)
Iron Sucrose Injection What is this medication? IRON SUCROSE (EYE ern SOO krose) treats low levels of iron (iron deficiency anemia) in people with kidney disease. Iron is a mineral that plays an important role in making red blood cells, which carry oxygen from your lungs to the rest of your body. This medicine may be used for other purposes; ask your health care provider or pharmacist if you have questions. COMMON BRAND NAME(S): Venofer What should I tell my care team before I take this medication? They need to know if you have any of these conditions: Anemia not caused by low iron levels Heart disease High levels of iron in the blood Kidney disease Liver disease An unusual or allergic reaction to iron, other medications, foods, dyes, or preservatives Pregnant or trying to get pregnant Breastfeeding How should I use this medication? This medication is for infusion into a vein. It is given in a hospital or clinic setting. Talk to your care team about the use of this medication in children. While this medication may be prescribed for children as young as 2 years for selected conditions, precautions do apply. Overdosage: If you think you have taken too much of this medicine contact a poison control center or emergency room at once. NOTE: This medicine is only for you. Do not share this medicine with others. What if I miss a dose? Keep appointments for follow-up doses. It is important not to miss your dose. Call your care team if you are unable to keep an appointment. What may interact with this medication? Do not take this medication with any of the following: Deferoxamine Dimercaprol Other iron products This medication may also interact with the following: Chloramphenicol Deferasirox This list may not describe all possible interactions. Give your health care provider a list of all the medicines, herbs, non-prescription drugs, or dietary supplements you use. Also tell them if you smoke,  drink alcohol, or use illegal drugs. Some items may interact with your medicine. What should I watch for while using this medication? Visit your care team regularly. Tell your care team if your symptoms do not start to get better or if they get worse. You may need blood work done while you are taking this medication. You may need to follow a special diet. Talk to your care team. Foods that contain iron include: whole grains/cereals, dried fruits, beans, or peas, leafy green vegetables, and organ meats (liver, kidney). What side effects may I notice from receiving this medication? Side effects that you should report to your care team as soon as possible: Allergic reactions--skin rash, itching, hives, swelling of the face, lips, tongue, or throat Low blood pressure--dizziness, feeling faint or lightheaded, blurry vision Shortness of breath Side effects that usually do not require medical attention (report to your care team if they continue or are bothersome): Flushing Headache Joint pain Muscle pain Nausea Pain, redness, or irritation at injection site This list may not describe all possible side effects. Call your doctor for medical advice about side effects. You may report side effects to FDA at 1-800-FDA-1088. Where should I keep my medication? This medication is given in a hospital or clinic and will not be stored at home. NOTE: This sheet is a summary. It may not cover all possible information. If you have questions about this medicine, talk to your doctor, pharmacist, or health care provider.  2024 Elsevier/Gold Standard (2022-04-22 00:00:00)  

## 2023-05-12 NOTE — Progress Notes (Signed)
Declined 30 minute post observation. Aware of risks. Vitals stable at discharge.  

## 2023-05-13 MED FILL — Iron Sucrose Inj 20 MG/ML (Fe Equiv): INTRAVENOUS | Qty: 10 | Status: AC

## 2023-05-14 ENCOUNTER — Inpatient Hospital Stay: Payer: Medicare HMO

## 2023-05-14 VITALS — BP 131/68 | HR 81 | Temp 100.0°F | Resp 16

## 2023-05-14 DIAGNOSIS — D509 Iron deficiency anemia, unspecified: Secondary | ICD-10-CM

## 2023-05-14 MED ORDER — SODIUM CHLORIDE 0.9 % IV SOLN
Freq: Once | INTRAVENOUS | Status: AC
  Filled 2023-05-14: qty 250

## 2023-05-14 MED ORDER — SODIUM CHLORIDE 0.9 % IV SOLN
200.0000 mg | Freq: Once | INTRAVENOUS | Status: AC
  Administered 2023-05-14: 200 mg via INTRAVENOUS
  Filled 2023-05-14: qty 200

## 2023-05-14 MED ORDER — SODIUM CHLORIDE 0.9% FLUSH
10.0000 mL | Freq: Once | INTRAVENOUS | Status: AC | PRN
  Administered 2023-05-14: 10 mL
  Filled 2023-05-14: qty 10

## 2023-05-14 NOTE — Progress Notes (Signed)
Pt tolerated treatment without complaints.  Vss.  Pt refused 30 minute post observation.  Pt understands risks.

## 2023-05-18 MED FILL — Iron Sucrose Inj 20 MG/ML (Fe Equiv): INTRAVENOUS | Qty: 10 | Status: AC

## 2023-05-19 ENCOUNTER — Inpatient Hospital Stay: Payer: Medicare HMO

## 2023-05-19 VITALS — BP 123/56 | HR 78 | Temp 97.8°F | Resp 18

## 2023-05-19 DIAGNOSIS — D509 Iron deficiency anemia, unspecified: Secondary | ICD-10-CM

## 2023-05-19 MED ORDER — SODIUM CHLORIDE 0.9 % IV SOLN
INTRAVENOUS | Status: AC
  Filled 2023-05-19: qty 250

## 2023-05-19 MED ORDER — SODIUM CHLORIDE 0.9 % IV SOLN
200.0000 mg | Freq: Once | INTRAVENOUS | Status: AC
  Administered 2023-05-19: 200 mg via INTRAVENOUS
  Filled 2023-05-19: qty 10

## 2023-05-19 NOTE — Patient Instructions (Signed)
Iron Sucrose Injection What is this medication? IRON SUCROSE (EYE ern SOO krose) treats low levels of iron (iron deficiency anemia) in people with kidney disease. Iron is a mineral that plays an important role in making red blood cells, which carry oxygen from your lungs to the rest of your body. This medicine may be used for other purposes; ask your health care provider or pharmacist if you have questions. COMMON BRAND NAME(S): Venofer What should I tell my care team before I take this medication? They need to know if you have any of these conditions: Anemia not caused by low iron levels Heart disease High levels of iron in the blood Kidney disease Liver disease An unusual or allergic reaction to iron, other medications, foods, dyes, or preservatives Pregnant or trying to get pregnant Breastfeeding How should I use this medication? This medication is for infusion into a vein. It is given in a hospital or clinic setting. Talk to your care team about the use of this medication in children. While this medication may be prescribed for children as young as 2 years for selected conditions, precautions do apply. Overdosage: If you think you have taken too much of this medicine contact a poison control center or emergency room at once. NOTE: This medicine is only for you. Do not share this medicine with others. What if I miss a dose? Keep appointments for follow-up doses. It is important not to miss your dose. Call your care team if you are unable to keep an appointment. What may interact with this medication? Do not take this medication with any of the following: Deferoxamine Dimercaprol Other iron products This medication may also interact with the following: Chloramphenicol Deferasirox This list may not describe all possible interactions. Give your health care provider a list of all the medicines, herbs, non-prescription drugs, or dietary supplements you use. Also tell them if you smoke,  drink alcohol, or use illegal drugs. Some items may interact with your medicine. What should I watch for while using this medication? Visit your care team regularly. Tell your care team if your symptoms do not start to get better or if they get worse. You may need blood work done while you are taking this medication. You may need to follow a special diet. Talk to your care team. Foods that contain iron include: whole grains/cereals, dried fruits, beans, or peas, leafy green vegetables, and organ meats (liver, kidney). What side effects may I notice from receiving this medication? Side effects that you should report to your care team as soon as possible: Allergic reactions--skin rash, itching, hives, swelling of the face, lips, tongue, or throat Low blood pressure--dizziness, feeling faint or lightheaded, blurry vision Shortness of breath Side effects that usually do not require medical attention (report to your care team if they continue or are bothersome): Flushing Headache Joint pain Muscle pain Nausea Pain, redness, or irritation at injection site This list may not describe all possible side effects. Call your doctor for medical advice about side effects. You may report side effects to FDA at 1-800-FDA-1088. Where should I keep my medication? This medication is given in a hospital or clinic. It will not be stored at home. NOTE: This sheet is a summary. It may not cover all possible information. If you have questions about this medicine, talk to your doctor, pharmacist, or health care provider.  2024 Elsevier/Gold Standard (2023-03-19 00:00:00)

## 2023-05-21 ENCOUNTER — Inpatient Hospital Stay: Payer: Medicare HMO

## 2023-05-21 VITALS — BP 115/53 | HR 87 | Temp 98.4°F | Resp 16

## 2023-05-21 DIAGNOSIS — D509 Iron deficiency anemia, unspecified: Secondary | ICD-10-CM | POA: Diagnosis not present

## 2023-05-21 MED ORDER — SODIUM CHLORIDE 0.9 % IV SOLN
200.0000 mg | Freq: Once | INTRAVENOUS | Status: AC
  Administered 2023-05-21: 200 mg via INTRAVENOUS
  Filled 2023-05-21: qty 10

## 2023-05-21 MED ORDER — SODIUM CHLORIDE 0.9 % IV SOLN
INTRAVENOUS | Status: DC
  Filled 2023-05-21 (×2): qty 250

## 2023-05-21 NOTE — Progress Notes (Signed)
Pt tolerated treatment without complaints.  VSS.  Pt refused 30 minute post observation.  Pt understands risks.   

## 2023-05-21 NOTE — Patient Instructions (Signed)
Iron Sucrose Injection What is this medication? IRON SUCROSE (EYE ern SOO krose) treats low levels of iron (iron deficiency anemia) in people with kidney disease. Iron is a mineral that plays an important role in making red blood cells, which carry oxygen from your lungs to the rest of your body. This medicine may be used for other purposes; ask your health care provider or pharmacist if you have questions. COMMON BRAND NAME(S): Venofer What should I tell my care team before I take this medication? They need to know if you have any of these conditions: Anemia not caused by low iron levels Heart disease High levels of iron in the blood Kidney disease Liver disease An unusual or allergic reaction to iron, other medications, foods, dyes, or preservatives Pregnant or trying to get pregnant Breastfeeding How should I use this medication? This medication is for infusion into a vein. It is given in a hospital or clinic setting. Talk to your care team about the use of this medication in children. While this medication may be prescribed for children as young as 2 years for selected conditions, precautions do apply. Overdosage: If you think you have taken too much of this medicine contact a poison control center or emergency room at once. NOTE: This medicine is only for you. Do not share this medicine with others. What if I miss a dose? Keep appointments for follow-up doses. It is important not to miss your dose. Call your care team if you are unable to keep an appointment. What may interact with this medication? Do not take this medication with any of the following: Deferoxamine Dimercaprol Other iron products This medication may also interact with the following: Chloramphenicol Deferasirox This list may not describe all possible interactions. Give your health care provider a list of all the medicines, herbs, non-prescription drugs, or dietary supplements you use. Also tell them if you smoke,  drink alcohol, or use illegal drugs. Some items may interact with your medicine. What should I watch for while using this medication? Visit your care team regularly. Tell your care team if your symptoms do not start to get better or if they get worse. You may need blood work done while you are taking this medication. You may need to follow a special diet. Talk to your care team. Foods that contain iron include: whole grains/cereals, dried fruits, beans, or peas, leafy green vegetables, and organ meats (liver, kidney). What side effects may I notice from receiving this medication? Side effects that you should report to your care team as soon as possible: Allergic reactions--skin rash, itching, hives, swelling of the face, lips, tongue, or throat Low blood pressure--dizziness, feeling faint or lightheaded, blurry vision Shortness of breath Side effects that usually do not require medical attention (report to your care team if they continue or are bothersome): Flushing Headache Joint pain Muscle pain Nausea Pain, redness, or irritation at injection site This list may not describe all possible side effects. Call your doctor for medical advice about side effects. You may report side effects to FDA at 1-800-FDA-1088. Where should I keep my medication? This medication is given in a hospital or clinic. It will not be stored at home. NOTE: This sheet is a summary. It may not cover all possible information. If you have questions about this medicine, talk to your doctor, pharmacist, or health care provider.  2024 Elsevier/Gold Standard (2023-03-19 00:00:00)

## 2023-08-25 ENCOUNTER — Inpatient Hospital Stay: Payer: Medicare HMO | Attending: Oncology

## 2023-08-25 DIAGNOSIS — D509 Iron deficiency anemia, unspecified: Secondary | ICD-10-CM | POA: Diagnosis present

## 2023-08-25 LAB — CBC WITH DIFFERENTIAL (CANCER CENTER ONLY)
Abs Immature Granulocytes: 0.03 10*3/uL (ref 0.00–0.07)
Basophils Absolute: 0 10*3/uL (ref 0.0–0.1)
Basophils Relative: 0 %
Eosinophils Absolute: 0.8 10*3/uL — ABNORMAL HIGH (ref 0.0–0.5)
Eosinophils Relative: 9 %
HCT: 31.1 % — ABNORMAL LOW (ref 39.0–52.0)
Hemoglobin: 9.9 g/dL — ABNORMAL LOW (ref 13.0–17.0)
Immature Granulocytes: 0 %
Lymphocytes Relative: 18 %
Lymphs Abs: 1.6 10*3/uL (ref 0.7–4.0)
MCH: 24.4 pg — ABNORMAL LOW (ref 26.0–34.0)
MCHC: 31.8 g/dL (ref 30.0–36.0)
MCV: 76.6 fL — ABNORMAL LOW (ref 80.0–100.0)
Monocytes Absolute: 0.6 10*3/uL (ref 0.1–1.0)
Monocytes Relative: 6 %
Neutro Abs: 5.9 10*3/uL (ref 1.7–7.7)
Neutrophils Relative %: 67 %
Platelet Count: 443 10*3/uL — ABNORMAL HIGH (ref 150–400)
RBC: 4.06 MIL/uL — ABNORMAL LOW (ref 4.22–5.81)
RDW: 15.4 % (ref 11.5–15.5)
WBC Count: 8.9 10*3/uL (ref 4.0–10.5)
nRBC: 0 % (ref 0.0–0.2)

## 2023-08-25 LAB — IRON AND TIBC
Iron: 28 ug/dL — ABNORMAL LOW (ref 45–182)
Saturation Ratios: 5 % — ABNORMAL LOW (ref 17.9–39.5)
TIBC: 546 ug/dL — ABNORMAL HIGH (ref 250–450)
UIBC: 518 ug/dL

## 2023-08-25 LAB — FERRITIN: Ferritin: 4 ng/mL — ABNORMAL LOW (ref 24–336)

## 2023-08-26 ENCOUNTER — Encounter: Payer: Self-pay | Admitting: Oncology

## 2023-08-26 ENCOUNTER — Inpatient Hospital Stay (HOSPITAL_BASED_OUTPATIENT_CLINIC_OR_DEPARTMENT_OTHER): Payer: Medicare HMO | Admitting: Oncology

## 2023-08-26 ENCOUNTER — Inpatient Hospital Stay: Payer: Medicare HMO

## 2023-08-26 VITALS — BP 118/66 | HR 89 | Temp 97.4°F | Resp 16 | Ht 67.0 in | Wt 133.9 lb

## 2023-08-26 VITALS — BP 122/78 | HR 78

## 2023-08-26 DIAGNOSIS — D509 Iron deficiency anemia, unspecified: Secondary | ICD-10-CM

## 2023-08-26 MED ORDER — SODIUM CHLORIDE 0.9% FLUSH
10.0000 mL | Freq: Once | INTRAVENOUS | Status: AC | PRN
  Administered 2023-08-26: 10 mL
  Filled 2023-08-26: qty 10

## 2023-08-26 MED ORDER — IRON SUCROSE 20 MG/ML IV SOLN
200.0000 mg | Freq: Once | INTRAVENOUS | Status: AC
  Administered 2023-08-26: 200 mg via INTRAVENOUS
  Filled 2023-08-26: qty 10

## 2023-08-26 NOTE — Progress Notes (Signed)
[facility]  [phone] [phone]  ID: Ryan Bonilla OB: 1956-07-11  MR#: [id number]  QIO#:[id number]  Patient Care Team: Gracelyn Nurse, MD as PCP - General (Internal Medicine) Jeralyn Ruths, MD as Consulting Physician (Hematology and Oncology)  CHIEF COMPLAINT: Iron deficiency anemia.  INTERVAL HISTORY: Patient returns to clinic today for repeat laboratory work, further evaluation, and consideration of additional IV Venofer.  Patient states he felt improved for approximately 2 months after his last infusion, but more recently has noticed increasing fatigue.  He otherwise has felt well.  He has no neurologic complaints.  He denies any recent fevers or illnesses.  He has no chest pain, shortness of breath, cough, or hemoptysis.  He denies any nausea, vomiting, constipation, or diarrhea.  He denies any melena or hematochezia.  He has no urinary complaints.  Patient offers no further specific complaints today.  REVIEW OF SYSTEMS:   Review of Systems  Constitutional:  Positive for malaise/fatigue. Negative for fever and weight loss.  Respiratory: Negative.  Negative for cough, hemoptysis and shortness of breath.   Cardiovascular: Negative.  Negative for chest pain and leg swelling.  Gastrointestinal: Negative.  Negative for abdominal pain, blood in stool and melena.  Genitourinary: Negative.  Negative for hematuria.  Musculoskeletal: Negative.  Negative for back pain.  Skin: Negative.  Negative for rash.  Neurological:  Positive for weakness. Negative for dizziness, focal weakness and headaches.  Psychiatric/Behavioral: Negative.  The patient is not nervous/anxious.     As per HPI. Otherwise, a complete review of systems is negative.  PAST MEDICAL HISTORY: Past Medical History:  Diagnosis Date   Esophageal stricture    Hyperlipidemia    Hypertension    Kidney stones     PAST SURGICAL HISTORY: Past Surgical History:  Procedure  Laterality Date   COLONOSCOPY     COLONOSCOPY WITH PROPOFOL N/A 10/10/2015   Procedure: COLONOSCOPY WITH PROPOFOL;  Surgeon: Wallace Cullens, MD;  Location: [facility] ENDOSCOPY;  Service: Gastroenterology;  Laterality: N/A;   ESOPHAGOGASTRODUODENOSCOPY     ESOPHAGOGASTRODUODENOSCOPY (EGD) WITH PROPOFOL N/A 10/10/2015   Procedure: ESOPHAGOGASTRODUODENOSCOPY (EGD) WITH PROPOFOL;  Surgeon: Wallace Cullens, MD;  Location: [facility] ENDOSCOPY;  Service: Gastroenterology;  Laterality: N/A;    FAMILY HISTORY: Family History  Problem Relation Age of Onset   Hypertension Father    Anemia Father     ADVANCED DIRECTIVES (Y/N):  N  HEALTH MAINTENANCE: Social History   Tobacco Use   Smoking status: Never   Smokeless tobacco: Never  Substance Use Topics   Alcohol use: Not Currently    Alcohol/week: 0.0 standard drinks of alcohol   Drug use: Not Currently     Colonoscopy:  PAP:  Bone density:  Lipid panel:  Allergies  Allergen Reactions   Penicillins     Current Outpatient Medications  Medication Sig Dispense Refill   atorvastatin (LIPITOR) 10 MG tablet Take 40 mg by mouth daily.     cyanocobalamin (VITAMIN B12) 1000 MCG tablet Take 1,000 mcg by mouth daily.     fluticasone (FLONASE) 50 MCG/ACT nasal spray USE 2 SPRAYS IN EACH  NOSTRIL DAILY 48 g 3   lisinopril (ZESTRIL) 20 MG tablet Take 20 mg by mouth daily.     aspirin EC 81 MG tablet Take 81 mg by mouth daily. Swallow whole. (Patient not taking: Reported on 08/26/2023)     cyanocobalamin (,VITAMIN B-12,) 1000 MCG/ML injection Inject 1 ml (1000 units) every 30 days (Patient not taking: Reported on  05/07/2023) 3 mL 3   Cyanocobalamin 1000 MCG/ML KIT Inject 1 Dose as directed every 30 (thirty) days. (Patient not taking: Reported on 05/07/2023) 1 kit 12   gemfibrozil (LOPID) 600 MG tablet Take 600 mg by mouth 2 (two) times daily before a meal. (Patient not taking: Reported on 05/07/2023)     Syringe/Needle, Disp, (SYRINGE 3CC/25GX1") 25G X 1" 3 ML MISC Use  to inject Cyanocobalamin every 30 days (Patient not taking: Reported on 05/07/2023) 3 each 3   No current facility-administered medications for this visit.   Facility-Administered Medications Ordered in Other Visits  Medication Dose Route Frequency Provider Last Rate Last Admin   0.9 %  sodium chloride infusion   Intravenous Continuous Jeralyn Ruths, MD   Stopped at 05/19/23 1356    OBJECTIVE: Vitals:   08/26/23 0923  BP: 118/66  Pulse: 89  Resp: 16  Temp: (!) 97.4 F (36.3 C)  SpO2: 100%     Body mass index is 20.97 kg/m.    ECOG FS:0 - Asymptomatic  General: Well-developed, well-nourished, no acute distress. Eyes: Pink conjunctiva, anicteric sclera. HEENT: Normocephalic, moist mucous membranes. Lungs: No audible wheezing or coughing. Heart: Regular rate and rhythm. Abdomen: Soft, nontender, no obvious distention. Musculoskeletal: No edema, cyanosis, or clubbing. Neuro: Alert, answering all questions appropriately. Cranial nerves grossly intact. Skin: No rashes or petechiae noted. Psych: Normal affect.  LAB RESULTS:  Lab Results  Component Value Date   NA 138 12/18/2019   K 3.9 12/18/2019   CL 104 12/18/2019   CO2 28 12/18/2019   GLUCOSE 70 03/12/2021   BUN 17 12/18/2019   CREATININE 0.93 12/18/2019   CALCIUM 9.5 12/18/2019   PROT 7.9 12/18/2019   ALBUMIN 4.3 12/18/2019   AST 18 12/18/2019   ALT 18 12/18/2019   ALKPHOS 73 12/18/2019   BILITOT 0.7 12/18/2019   GFRNONAA >60 12/18/2019   GFRAA >60 12/18/2019    Lab Results  Component Value Date   WBC 8.9 08/25/2023   NEUTROABS 5.9 08/25/2023   HGB 9.9 (L) 08/25/2023   HCT 31.1 (L) 08/25/2023   MCV 76.6 (L) 08/25/2023   PLT 443 (H) 08/25/2023   Lab Results  Component Value Date   IRON 28 (L) 08/25/2023   TIBC 546 (H) 08/25/2023   IRONPCTSAT 5 (L) 08/25/2023   Lab Results  Component Value Date   FERRITIN 4 (L) 08/25/2023     STUDIES: No results found.  ASSESSMENT: Iron deficiency  anemia.  PLAN:    Iron deficiency anemia: Patient's hemoglobin only mildly improved to 9.9 and his iron stores remain significantly reduced.  He is also symptomatic.  Colonoscopy and EGD in November 2021 were reported as normal with no significant pathology.  Proceed with 200 mg IV Venofer today.  Return to clinic 4 times over the next 2 to 3 weeks to receive additional treatment.  Patient would then return to clinic in 3 months with repeat laboratory work, further evaluation, and continuation of treatment if needed. Thrombocytosis: Likely reactive.  Monitor.  I spent a total of 30 minutes reviewing chart data, face-to-face evaluation with the patient, counseling and coordination of care as detailed above.   Patient expressed understanding and was in agreement with this plan. He also understands that He can call clinic at any time with any questions, concerns, or complaints.     Jeralyn Ruths, MD   08/27/2023 1:33 PM

## 2023-08-26 NOTE — Progress Notes (Signed)
States that he thinks he will need Iron every 2 months. States he started feeling tired a month and a half ago.

## 2023-08-27 ENCOUNTER — Encounter: Payer: Self-pay | Admitting: Oncology

## 2023-08-31 ENCOUNTER — Inpatient Hospital Stay: Payer: Medicare HMO | Attending: Oncology

## 2023-08-31 VITALS — BP 118/65 | HR 80 | Temp 97.9°F

## 2023-08-31 DIAGNOSIS — D509 Iron deficiency anemia, unspecified: Secondary | ICD-10-CM | POA: Diagnosis present

## 2023-08-31 MED ORDER — IRON SUCROSE 20 MG/ML IV SOLN
200.0000 mg | Freq: Once | INTRAVENOUS | Status: AC
Start: 2023-08-31 — End: 2023-08-31
  Administered 2023-08-31: 200 mg via INTRAVENOUS
  Filled 2023-08-31: qty 10

## 2023-08-31 NOTE — Patient Instructions (Signed)
Iron Sucrose Injection What is this medication? IRON SUCROSE (EYE ern SOO krose) treats low levels of iron (iron deficiency anemia) in people with kidney disease. Iron is a mineral that plays an important role in making red blood cells, which carry oxygen from your lungs to the rest of your body. This medicine may be used for other purposes; ask your health care provider or pharmacist if you have questions. COMMON BRAND NAME(S): Venofer What should I tell my care team before I take this medication? They need to know if you have any of these conditions: Anemia not caused by low iron levels Heart disease High levels of iron in the blood Kidney disease Liver disease An unusual or allergic reaction to iron, other medications, foods, dyes, or preservatives Pregnant or trying to get pregnant Breastfeeding How should I use this medication? This medication is for infusion into a vein. It is given in a hospital or clinic setting. Talk to your care team about the use of this medication in children. While this medication may be prescribed for children as young as 2 years for selected conditions, precautions do apply. Overdosage: If you think you have taken too much of this medicine contact a poison control center or emergency room at once. NOTE: This medicine is only for you. Do not share this medicine with others. What if I miss a dose? Keep appointments for follow-up doses. It is important not to miss your dose. Call your care team if you are unable to keep an appointment. What may interact with this medication? Do not take this medication with any of the following: Deferoxamine Dimercaprol Other iron products This medication may also interact with the following: Chloramphenicol Deferasirox This list may not describe all possible interactions. Give your health care provider a list of all the medicines, herbs, non-prescription drugs, or dietary supplements you use. Also tell them if you smoke,  drink alcohol, or use illegal drugs. Some items may interact with your medicine. What should I watch for while using this medication? Visit your care team regularly. Tell your care team if your symptoms do not start to get better or if they get worse. You may need blood work done while you are taking this medication. You may need to follow a special diet. Talk to your care team. Foods that contain iron include: whole grains/cereals, dried fruits, beans, or peas, leafy green vegetables, and organ meats (liver, kidney). What side effects may I notice from receiving this medication? Side effects that you should report to your care team as soon as possible: Allergic reactions--skin rash, itching, hives, swelling of the face, lips, tongue, or throat Low blood pressure--dizziness, feeling faint or lightheaded, blurry vision Shortness of breath Side effects that usually do not require medical attention (report to your care team if they continue or are bothersome): Flushing Headache Joint pain Muscle pain Nausea Pain, redness, or irritation at injection site This list may not describe all possible side effects. Call your doctor for medical advice about side effects. You may report side effects to FDA at 1-800-FDA-1088. Where should I keep my medication? This medication is given in a hospital or clinic. It will not be stored at home. NOTE: This sheet is a summary. It may not cover all possible information. If you have questions about this medicine, talk to your doctor, pharmacist, or health care provider.  2024 Elsevier/Gold Standard (2023-03-19 00:00:00)

## 2023-08-31 NOTE — Progress Notes (Signed)
Declined 30 minute observation. Aware of risks. Vitals stable at discharge.  

## 2023-09-02 ENCOUNTER — Inpatient Hospital Stay: Payer: Medicare HMO

## 2023-09-02 VITALS — BP 126/61 | HR 92 | Temp 98.6°F | Resp 18

## 2023-09-02 DIAGNOSIS — D509 Iron deficiency anemia, unspecified: Secondary | ICD-10-CM | POA: Diagnosis not present

## 2023-09-02 MED ORDER — SODIUM CHLORIDE 0.9% FLUSH
10.0000 mL | Freq: Once | INTRAVENOUS | Status: AC | PRN
  Administered 2023-09-02: 10 mL
  Filled 2023-09-02: qty 10

## 2023-09-02 MED ORDER — IRON SUCROSE 20 MG/ML IV SOLN
200.0000 mg | Freq: Once | INTRAVENOUS | Status: AC
  Administered 2023-09-02: 200 mg via INTRAVENOUS
  Filled 2023-09-02: qty 10

## 2023-09-06 ENCOUNTER — Other Ambulatory Visit: Payer: Medicare HMO

## 2023-09-06 ENCOUNTER — Other Ambulatory Visit: Payer: Self-pay | Admitting: Student

## 2023-09-06 ENCOUNTER — Ambulatory Visit
Admission: RE | Admit: 2023-09-06 | Discharge: 2023-09-06 | Disposition: A | Discharge status: Home / Self Care | Payer: Medicare HMO | Source: Ambulatory Visit | Attending: Student

## 2023-09-06 DIAGNOSIS — R059 Cough, unspecified: Secondary | ICD-10-CM

## 2023-09-07 ENCOUNTER — Inpatient Hospital Stay: Payer: Medicare HMO

## 2023-09-07 ENCOUNTER — Ambulatory Visit: Payer: Medicare HMO | Admitting: Oncology

## 2023-09-07 ENCOUNTER — Ambulatory Visit: Payer: Medicare HMO

## 2023-09-07 VITALS — BP 142/70 | HR 97 | Temp 98.4°F | Resp 18

## 2023-09-07 DIAGNOSIS — D509 Iron deficiency anemia, unspecified: Secondary | ICD-10-CM | POA: Diagnosis not present

## 2023-09-07 MED ORDER — SODIUM CHLORIDE 0.9% FLUSH
10.0000 mL | Freq: Once | INTRAVENOUS | Status: AC | PRN
  Administered 2023-09-07: 10 mL
  Filled 2023-09-07: qty 10

## 2023-09-07 MED ORDER — IRON SUCROSE 20 MG/ML IV SOLN
200.0000 mg | Freq: Once | INTRAVENOUS | Status: AC
  Administered 2023-09-07: 200 mg via INTRAVENOUS
  Filled 2023-09-07: qty 10

## 2023-09-09 ENCOUNTER — Inpatient Hospital Stay: Payer: Medicare HMO

## 2023-09-09 VITALS — BP 130/67 | HR 92 | Temp 97.6°F | Resp 18

## 2023-09-09 DIAGNOSIS — D509 Iron deficiency anemia, unspecified: Secondary | ICD-10-CM | POA: Diagnosis not present

## 2023-09-09 MED ORDER — SODIUM CHLORIDE 0.9% FLUSH
10.0000 mL | Freq: Once | INTRAVENOUS | Status: DC | PRN
  Filled 2023-09-09: qty 10

## 2023-09-09 MED ORDER — IRON SUCROSE 20 MG/ML IV SOLN
200.0000 mg | Freq: Once | INTRAVENOUS | Status: AC
  Administered 2023-09-09: 200 mg via INTRAVENOUS
  Filled 2023-09-09: qty 10

## 2023-10-26 ENCOUNTER — Encounter: Payer: Self-pay | Admitting: Oncology

## 2023-10-28 ENCOUNTER — Other Ambulatory Visit: Payer: Self-pay | Admitting: Student

## 2023-10-28 ENCOUNTER — Ambulatory Visit
Admission: RE | Admit: 2023-10-28 | Discharge: 2023-10-28 | Disposition: A | Discharge status: Home / Self Care | Payer: Medicare HMO | Source: Ambulatory Visit | Attending: Student | Admitting: Student

## 2023-10-28 DIAGNOSIS — R918 Other nonspecific abnormal finding of lung field: Secondary | ICD-10-CM

## 2023-11-24 NOTE — Progress Notes (Signed)
MRN : [id number]  Ryan Bonilla is a 68 y.o. (September 17, 1956) male who presents with chief complaint of check carotid arteries.  History of Present Illness:  Patient left before being seen   CT angiogram dated 11/19/2022 shows mixed plaque of the carotid bifurcations resulting in approximately 50% stenosis on the right and 15% stenosis on the left.    Carotid duplex ultrasound was performed today and demonstrates 1 to 39% stenosis bilateral internal carotid arteries normal flow dynamics were seen in the subclavian's bilaterally antegrade flow in both vertebral arteries  No outpatient medications have been marked as taking for the 11/25/23 encounter (Appointment) with Gilda Crease, Latina Craver, MD.    Past Medical History:  Diagnosis Date   Esophageal stricture    Hyperlipidemia    Hypertension    Kidney stones     Past Surgical History:  Procedure Laterality Date   COLONOSCOPY     COLONOSCOPY WITH PROPOFOL N/A 10/10/2015   Procedure: COLONOSCOPY WITH PROPOFOL;  Surgeon: Wallace Cullens, MD;  Location: [facility] ENDOSCOPY;  Service: Gastroenterology;  Laterality: N/A;   ESOPHAGOGASTRODUODENOSCOPY     ESOPHAGOGASTRODUODENOSCOPY (EGD) WITH PROPOFOL N/A 10/10/2015   Procedure: ESOPHAGOGASTRODUODENOSCOPY (EGD) WITH PROPOFOL;  Surgeon: Wallace Cullens, MD;  Location: [facility] ENDOSCOPY;  Service: Gastroenterology;  Laterality: N/A;    Social History Social History   Tobacco Use   Smoking status: Never   Smokeless tobacco: Never  Substance Use Topics   Alcohol use: Not Currently    Alcohol/week: 0.0 standard drinks of alcohol   Drug use: Not Currently    Family History Family History  Problem Relation Age of Onset   Hypertension Father    Anemia Father     Allergies  Allergen Reactions   Penicillins      REVIEW OF SYSTEMS (Negative unless checked)  Constitutional: [] Weight loss  [] Fever  [] Chills Cardiac: [] Chest pain   [] Chest pressure   [] Palpitations   [] Shortness of  breath when laying flat   [] Shortness of breath with exertion. Vascular:  [x] Pain in legs with walking   [] Pain in legs at rest  [] History of DVT   [] Phlebitis   [] Swelling in legs   [] Varicose veins   [] Non-healing ulcers Pulmonary:   [] Uses home oxygen   [] Productive cough   [] Hemoptysis   [] Wheeze  [] COPD   [] Asthma Neurologic:  [] Dizziness   [] Seizures   [] History of stroke   [] History of TIA  [] Aphasia   [] Vissual changes   [] Weakness or numbness in arm   [] Weakness or numbness in leg Musculoskeletal:   [] Joint swelling   [] Joint pain   [] Low back pain Hematologic:  [] Easy bruising  [] Easy bleeding   [] Hypercoagulable state   [] Anemic Gastrointestinal:  [] Diarrhea   [] Vomiting  [] Gastroesophageal reflux/heartburn   [] Difficulty swallowing. Genitourinary:  [] Chronic kidney disease   [] Difficult urination  [] Frequent urination   [] Blood in urine Skin:  [] Rashes   [] Ulcers  Psychological:  [] History of anxiety   []  History of major depression.  Physical Examination  There were no vitals filed for this visit. There is no height or weight on file to calculate BMI.  CBC Lab Results  Component Value Date   WBC 8.9 08/25/2023   HGB 9.9 (L) 08/25/2023   HCT 31.1 (L) 08/25/2023   MCV 76.6 (L) 08/25/2023   PLT 443 (H) 08/25/2023    BMET  Component Value Date/Time   NA 138 12/18/2019 1654   NA 139 10/20/2017 0827   K 3.9 12/18/2019 1654   CL 104 12/18/2019 1654   CO2 28 12/18/2019 1654   GLUCOSE 70 03/12/2021 1312   GLUCOSE 149 (H) 12/18/2019 1654   BUN 17 12/18/2019 1654   BUN 18 10/20/2017 0827   CREATININE 0.93 12/18/2019 1654   CALCIUM 9.5 12/18/2019 1654   GFRNONAA >60 12/18/2019 1654   GFRAA >60 12/18/2019 1654   CrCl cannot be calculated (Patient's most recent lab result is older than the maximum 21 days allowed.).  COAG No results found for: "INR", "PROTIME"  Radiology DG Chest 2 View Result Date: 11/02/2023 CLINICAL DATA:  OTHER NONSPECIFIC ABNORMAL FINDING IN  LUNG FIELD (R91.8) Rule out pneumonia.  Cough and congestion. EXAM: CHEST - 2 VIEW COMPARISON:  09/06/2023 FINDINGS: Small linear densities in the right lower lung could represent atelectasis or scarring. No focal airspace disease or lung consolidation. Heart and mediastinum are within normal limits. Atherosclerotic calcifications at the aortic arch. Trachea is midline. No pleural effusions. No acute bone abnormality. IMPRESSION: 1. No acute cardiopulmonary disease. 2. Small linear densities in the right lower lung could represent atelectasis or scarring. Electronically Signed   By: Richarda Overlie M.D.   On: 11/02/2023 16:50     Assessment/Plan 1. Bilateral carotid artery stenosis (Primary) Patient left before being seen.  Carotid ultrasound done today demonstrates 1 to 39% bilateral internal carotid artery stenosis     Levora Dredge, MD  11/24/2023 10:35 AM

## 2023-11-25 ENCOUNTER — Encounter (INDEPENDENT_AMBULATORY_CARE_PROVIDER_SITE_OTHER): Payer: Self-pay | Admitting: Vascular Surgery

## 2023-11-25 ENCOUNTER — Ambulatory Visit (INDEPENDENT_AMBULATORY_CARE_PROVIDER_SITE_OTHER): Payer: Medicare HMO | Admitting: Vascular Surgery

## 2023-11-25 ENCOUNTER — Ambulatory Visit (INDEPENDENT_AMBULATORY_CARE_PROVIDER_SITE_OTHER): Payer: Medicare HMO

## 2023-11-25 ENCOUNTER — Encounter (INDEPENDENT_AMBULATORY_CARE_PROVIDER_SITE_OTHER): Payer: Self-pay

## 2023-11-25 VITALS — BP 138/74 | HR 69 | Resp 18 | Ht 66.0 in | Wt 135.2 lb

## 2023-11-25 DIAGNOSIS — I10 Essential (primary) hypertension: Secondary | ICD-10-CM

## 2023-11-25 DIAGNOSIS — I6523 Occlusion and stenosis of bilateral carotid arteries: Secondary | ICD-10-CM

## 2023-11-25 DIAGNOSIS — E785 Hyperlipidemia, unspecified: Secondary | ICD-10-CM

## 2023-11-26 ENCOUNTER — Other Ambulatory Visit: Payer: Self-pay | Admitting: *Deleted

## 2023-11-26 DIAGNOSIS — D509 Iron deficiency anemia, unspecified: Secondary | ICD-10-CM

## 2023-11-29 ENCOUNTER — Inpatient Hospital Stay: Payer: Medicare HMO | Attending: Oncology

## 2023-11-29 DIAGNOSIS — D509 Iron deficiency anemia, unspecified: Secondary | ICD-10-CM | POA: Diagnosis present

## 2023-11-29 LAB — CBC WITH DIFFERENTIAL/PLATELET
Abs Immature Granulocytes: 0.03 K/uL (ref 0.00–0.07)
Basophils Absolute: 0 K/uL (ref 0.0–0.1)
Basophils Relative: 0 %
Eosinophils Absolute: 0.1 K/uL (ref 0.0–0.5)
Eosinophils Relative: 2 %
HCT: 38.2 % — ABNORMAL LOW (ref 39.0–52.0)
Hemoglobin: 12.5 g/dL — ABNORMAL LOW (ref 13.0–17.0)
Immature Granulocytes: 0 %
Lymphocytes Relative: 16 %
Lymphs Abs: 1.4 K/uL (ref 0.7–4.0)
MCH: 28.3 pg (ref 26.0–34.0)
MCHC: 32.7 g/dL (ref 30.0–36.0)
MCV: 86.6 fL (ref 80.0–100.0)
Monocytes Absolute: 0.4 K/uL (ref 0.1–1.0)
Monocytes Relative: 5 %
Neutro Abs: 6.8 K/uL (ref 1.7–7.7)
Neutrophils Relative %: 77 %
Platelets: 381 K/uL (ref 150–400)
RBC: 4.41 MIL/uL (ref 4.22–5.81)
RDW: 15.8 % — ABNORMAL HIGH (ref 11.5–15.5)
WBC: 8.8 K/uL (ref 4.0–10.5)
nRBC: 0 % (ref 0.0–0.2)

## 2023-11-29 LAB — IRON AND TIBC
Iron: 31 ug/dL — ABNORMAL LOW (ref 45–182)
Saturation Ratios: 6 % — ABNORMAL LOW (ref 17.9–39.5)
TIBC: 486 ug/dL — ABNORMAL HIGH (ref 250–450)
UIBC: 455 ug/dL

## 2023-11-29 LAB — FERRITIN: Ferritin: 12 ng/mL — ABNORMAL LOW (ref 24–336)

## 2023-11-30 ENCOUNTER — Encounter: Payer: Self-pay | Admitting: Oncology

## 2023-11-30 ENCOUNTER — Inpatient Hospital Stay: Payer: Medicare HMO

## 2023-11-30 ENCOUNTER — Inpatient Hospital Stay: Payer: Medicare HMO | Admitting: Oncology

## 2023-11-30 VITALS — BP 112/65 | HR 92 | Temp 98.9°F | Resp 16 | Ht 66.0 in | Wt 136.0 lb

## 2023-11-30 VITALS — BP 117/73

## 2023-11-30 DIAGNOSIS — D509 Iron deficiency anemia, unspecified: Secondary | ICD-10-CM

## 2023-11-30 MED ORDER — IRON SUCROSE 20 MG/ML IV SOLN
200.0000 mg | Freq: Once | INTRAVENOUS | Status: AC
  Administered 2023-11-30: 200 mg via INTRAVENOUS

## 2023-11-30 NOTE — Patient Instructions (Signed)

## 2023-11-30 NOTE — Progress Notes (Signed)
 [facility]  [phone] Fax:(336) [phone]  ID: Ryan Bonilla OB: 13-Dec-1955  MR#: [id number]  RDW#:[id number]  Patient Care Team: Ryan Norleen BIRCH, MD as PCP - General (Internal Medicine) Ryan Evalene PARAS, MD as Consulting Physician (Hematology and Oncology)  CHIEF COMPLAINT: Iron  deficiency anemia.  INTERVAL HISTORY: Patient returns to clinic today for repeat laboratory work, further evaluation, and consideration of additional IV Venofer .  He continues to have chronic weakness and fatigue, but has noticed an improvement.  He continues to be active going to the gym on a daily basis and working full-time.  He otherwise feels well. He has no neurologic complaints.  He denies any recent fevers or illnesses.  He has no chest pain, shortness of breath, cough, or hemoptysis.  He denies any nausea, vomiting, constipation, or diarrhea.  He denies any melena or hematochezia.  He has no urinary complaints.  Patient offers no further specific complaints today.  REVIEW OF SYSTEMS:   Review of Systems  Constitutional:  Positive for malaise/fatigue. Negative for fever and weight loss.  Respiratory: Negative.  Negative for cough, hemoptysis and shortness of breath.   Cardiovascular: Negative.  Negative for chest pain and leg swelling.  Gastrointestinal: Negative.  Negative for abdominal pain, blood in stool and melena.  Genitourinary: Negative.  Negative for hematuria.  Musculoskeletal: Negative.  Negative for back pain.  Skin: Negative.  Negative for rash.  Neurological:  Positive for weakness. Negative for dizziness, focal weakness and headaches.  Psychiatric/Behavioral: Negative.  The patient is not nervous/anxious.     As per HPI. Otherwise, a complete review of systems is negative.  PAST MEDICAL HISTORY: Past Medical History:  Diagnosis Date   Esophageal stricture    Hyperlipidemia    Hypertension    Kidney stones     PAST SURGICAL HISTORY: Past  Surgical History:  Procedure Laterality Date   COLONOSCOPY     COLONOSCOPY WITH PROPOFOL  N/A 10/10/2015   Procedure: COLONOSCOPY WITH PROPOFOL ;  Surgeon: Deward CINDERELLA Piedmont, MD;  Location: [facility] ENDOSCOPY;  Service: Gastroenterology;  Laterality: N/A;   ESOPHAGOGASTRODUODENOSCOPY     ESOPHAGOGASTRODUODENOSCOPY (EGD) WITH PROPOFOL  N/A 10/10/2015   Procedure: ESOPHAGOGASTRODUODENOSCOPY (EGD) WITH PROPOFOL ;  Surgeon: Deward CINDERELLA Piedmont, MD;  Location: [facility] ENDOSCOPY;  Service: Gastroenterology;  Laterality: N/A;    FAMILY HISTORY: Family History  Problem Relation Age of Onset   Hypertension Father    Anemia Father     ADVANCED DIRECTIVES (Y/N):  N  HEALTH MAINTENANCE: Social History   Tobacco Use   Smoking status: Never   Smokeless tobacco: Never  Substance Use Topics   Alcohol use: Not Currently    Alcohol/week: 0.0 standard drinks of alcohol   Drug use: Not Currently     Colonoscopy:  PAP:  Bone density:  Lipid panel:  Allergies  Allergen Reactions   Penicillins     Current Outpatient Medications  Medication Sig Dispense Refill   atorvastatin (LIPITOR) 10 MG tablet Take 40 mg by mouth daily.     cyanocobalamin  (VITAMIN B12) 1000 MCG tablet Take 1,000 mcg by mouth daily.     fluticasone  (FLONASE ) 50 MCG/ACT nasal spray USE 2 SPRAYS IN EACH  NOSTRIL DAILY 48 g 3   lisinopril (ZESTRIL) 20 MG tablet Take 20 mg by mouth daily.     tamsulosin (FLOMAX) 0.4 MG CAPS capsule Take by mouth.     aspirin EC 81 MG tablet Take 81 mg by mouth daily. Swallow whole. (Patient not taking: Reported on 08/26/2023)  cyanocobalamin  (,VITAMIN B-12,) 1000 MCG/ML injection Inject 1 ml (1000 units) every 30 days (Patient not taking: Reported on 11/30/2023) 3 mL 3   Cyanocobalamin  1000 MCG/ML KIT Inject 1 Dose as directed every 30 (thirty) days. (Patient not taking: Reported on 11/30/2023) 1 kit 12   gemfibrozil (LOPID) 600 MG tablet Take 600 mg by mouth 2 (two) times daily before a meal. (Patient not taking:  Reported on 05/07/2023)     Syringe/Needle, Disp, (SYRINGE 3CC/25GX1) 25G X 1 3 ML MISC Use to inject Cyanocobalamin  every 30 days (Patient not taking: Reported on 05/07/2023) 3 each 3   No current facility-administered medications for this visit.   Facility-Administered Medications Ordered in Other Visits  Medication Dose Route Frequency Provider Last Rate Last Admin   0.9 %  sodium chloride  infusion   Intravenous Continuous Ryan Evalene PARAS, MD   Stopped at 05/19/23 1356    OBJECTIVE: Vitals:   11/30/23 1314  BP: 112/65  Pulse: 92  Resp: 16  Temp: 98.9 F (37.2 C)  SpO2: 98%     Body mass index is 21.95 kg/m.    ECOG FS:0 - Asymptomatic  General: Well-developed, well-nourished, no acute distress. Eyes: Pink conjunctiva, anicteric sclera. HEENT: Normocephalic, moist mucous membranes. Lungs: No audible wheezing or coughing. Heart: Regular rate and rhythm. Abdomen: Soft, nontender, no obvious distention. Musculoskeletal: No edema, cyanosis, or clubbing. Neuro: Alert, answering all questions appropriately. Cranial nerves grossly intact. Skin: No rashes or petechiae noted. Psych: Normal affect.  LAB RESULTS:  Lab Results  Component Value Date   NA 138 12/18/2019   K 3.9 12/18/2019   CL 104 12/18/2019   CO2 28 12/18/2019   GLUCOSE 70 03/12/2021   BUN 17 12/18/2019   CREATININE 0.93 12/18/2019   CALCIUM 9.5 12/18/2019   PROT 7.9 12/18/2019   ALBUMIN 4.3 12/18/2019   AST 18 12/18/2019   ALT 18 12/18/2019   ALKPHOS 73 12/18/2019   BILITOT 0.7 12/18/2019   GFRNONAA >60 12/18/2019   GFRAA >60 12/18/2019    Lab Results  Component Value Date   WBC 8.8 11/29/2023   NEUTROABS 6.8 11/29/2023   HGB 12.5 (L) 11/29/2023   HCT 38.2 (L) 11/29/2023   MCV 86.6 11/29/2023   PLT 381 11/29/2023   Lab Results  Component Value Date   IRON  31 (L) 11/29/2023   TIBC 486 (H) 11/29/2023   IRONPCTSAT 6 (L) 11/29/2023   Lab Results  Component Value Date   FERRITIN 12 (L)  11/29/2023     STUDIES: VAS US  CAROTID Result Date: 11/25/2023 Carotid Arterial Duplex Study Patient Name:  Ryan Bonilla  Date of Exam:   11/25/2023 Medical Rec #: [id number]       Accession #:    [id number] Date of Birth: 08/09/1956       Patient Gender: M Patient Age:   76 years Exam Location:  [facility] Procedure:      VAS US  CAROTID Referring Phys: CORDELLA SHAWL --------------------------------------------------------------------------------  Indications:       Carotid artery disease. Comparison Study:  09/2022 Performing Technologist: Jerel Croak RVT  Examination Guidelines: A complete evaluation includes B-mode imaging, spectral Doppler, color Doppler, and power Doppler as needed of all accessible portions of each vessel. Bilateral testing is considered an integral part of a complete examination. Limited examinations for reoccurring indications may be performed as noted.  Right Carotid Findings: +----------+--------+--------+--------+-----------------------+--------+           PSV cm/sEDV cm/sStenosisPlaque Description  Comments +----------+--------+--------+--------+-----------------------+--------+ CCA Prox  61      17                                              +----------+--------+--------+--------+-----------------------+--------+ CCA Mid   73      18                                              +----------+--------+--------+--------+-----------------------+--------+ CCA Distal59      18                                              +----------+--------+--------+--------+-----------------------+--------+ ICA Prox  117     47      1-39%   smooth and heterogenous         +----------+--------+--------+--------+-----------------------+--------+ ICA Mid   101     38                                              +----------+--------+--------+--------+-----------------------+--------+ ICA Distal115     40                                               +----------+--------+--------+--------+-----------------------+--------+ ECA       97      14                                              +----------+--------+--------+--------+-----------------------+--------+ +----------+--------+-------+----------------+-------------------+           PSV cm/sEDV cmsDescribe        Arm Pressure (mmHG) +----------+--------+-------+----------------+-------------------+ [id number]             Multiphasic, WNL                    +----------+--------+-------+----------------+-------------------+ +---------+--------+--+--------+-+---------+ VertebralPSV cm/s31EDV cm/s7Antegrade +---------+--------+--+--------+-+---------+  Left Carotid Findings: +----------+--------+--------+--------+-----------------------+--------+           PSV cm/sEDV cm/sStenosisPlaque Description     Comments +----------+--------+--------+--------+-----------------------+--------+ CCA Prox  94      18                                              +----------+--------+--------+--------+-----------------------+--------+ CCA Mid   82      20                                              +----------+--------+--------+--------+-----------------------+--------+ CCA Distal61      16                                              +----------+--------+--------+--------+-----------------------+--------+  ICA Prox  66      22                                              +----------+--------+--------+--------+-----------------------+--------+ ICA Mid   93      30      1-39%   smooth and heterogenous         +----------+--------+--------+--------+-----------------------+--------+ ICA Distal92      30                                              +----------+--------+--------+--------+-----------------------+--------+ ECA       101     11                                               +----------+--------+--------+--------+-----------------------+--------+ +----------+--------+--------+----------------+-------------------+           PSV cm/sEDV cm/sDescribe        Arm Pressure (mmHG) +----------+--------+--------+----------------+-------------------+ [id number]             Multiphasic, WNL                    +----------+--------+--------+----------------+-------------------+ +---------+--------+--+--------+--+---------+ VertebralPSV cm/s49EDV cm/s11Antegrade +---------+--------+--+--------+--+---------+   Summary: Right Carotid: Velocities in the right ICA are consistent with a 1-39% stenosis.                The ECA appears <50% stenosed. Left Carotid: Velocities in the left ICA are consistent with a 1-39% stenosis.               The ECA appears <50% stenosed. Vertebrals:  Bilateral vertebral arteries demonstrate antegrade flow. Subclavians: Normal flow hemodynamics were seen in bilateral subclavian              arteries. *See table(s) above for measurements and observations.  Electronically signed by Cordella Shawl MD on 11/25/2023 at 4:41:50 PM.    Final     ASSESSMENT: Iron  deficiency anemia.  PLAN:    Iron  deficiency anemia: Patient's hemoglobin has significantly improved to 12.5, but his iron  stores remain reduced.  Colonoscopy and EGD in November 2021 were reported as normal with no significant pathology.  Proceed with 200 mg IV Venofer  today.  Return to clinic 4 times over the next 1 to 2 weeks for additional treatment.  Patient would then return to clinic in 4 months with repeat laboratory work, further evaluation, and consideration of additional treatment if needed.  Thrombocytosis: Resolved.  I spent a total of 30 minutes reviewing chart data, face-to-face evaluation with the patient, counseling and coordination of care as detailed above.    Patient expressed understanding and was in agreement with this plan. He also understands that He can call  clinic at any time with any questions, concerns, or complaints.     Evalene JINNY Reusing, MD   11/30/2023 1:22 PM

## 2023-12-08 ENCOUNTER — Inpatient Hospital Stay: Payer: Medicare HMO

## 2023-12-08 VITALS — BP 119/72 | HR 78 | Temp 98.3°F

## 2023-12-08 DIAGNOSIS — D509 Iron deficiency anemia, unspecified: Secondary | ICD-10-CM

## 2023-12-08 MED ORDER — SODIUM CHLORIDE 0.9% FLUSH
10.0000 mL | Freq: Once | INTRAVENOUS | Status: AC
Start: 2023-12-08 — End: 2023-12-08
  Administered 2023-12-08: 10 mL via INTRAVENOUS
  Filled 2023-12-08: qty 10

## 2023-12-08 MED ORDER — IRON SUCROSE 20 MG/ML IV SOLN
200.0000 mg | Freq: Once | INTRAVENOUS | Status: AC
  Administered 2023-12-08: 200 mg via INTRAVENOUS

## 2023-12-08 NOTE — Progress Notes (Signed)
Patient declined to wait the 30 minutes for post iron infusion observation today. Tolerated infusion well. VSS.

## 2023-12-10 ENCOUNTER — Inpatient Hospital Stay: Payer: Medicare HMO

## 2023-12-10 VITALS — BP 122/69 | HR 75 | Resp 16

## 2023-12-10 DIAGNOSIS — D509 Iron deficiency anemia, unspecified: Secondary | ICD-10-CM

## 2023-12-10 MED ORDER — IRON SUCROSE 20 MG/ML IV SOLN
200.0000 mg | Freq: Once | INTRAVENOUS | Status: AC
  Administered 2023-12-10: 200 mg via INTRAVENOUS
  Filled 2023-12-10: qty 10

## 2023-12-14 ENCOUNTER — Inpatient Hospital Stay: Payer: Medicare HMO

## 2023-12-14 VITALS — BP 130/75 | HR 90 | Temp 98.9°F | Resp 17

## 2023-12-14 DIAGNOSIS — D509 Iron deficiency anemia, unspecified: Secondary | ICD-10-CM | POA: Diagnosis not present

## 2023-12-14 MED ORDER — IRON SUCROSE 20 MG/ML IV SOLN
200.0000 mg | Freq: Once | INTRAVENOUS | Status: AC
  Administered 2023-12-14: 200 mg via INTRAVENOUS
  Filled 2023-12-14: qty 10

## 2023-12-17 ENCOUNTER — Inpatient Hospital Stay: Payer: Medicare HMO

## 2023-12-17 VITALS — BP 131/59 | HR 84 | Temp 98.6°F | Resp 16

## 2023-12-17 DIAGNOSIS — D509 Iron deficiency anemia, unspecified: Secondary | ICD-10-CM | POA: Diagnosis not present

## 2023-12-17 MED ORDER — IRON SUCROSE 20 MG/ML IV SOLN
200.0000 mg | Freq: Once | INTRAVENOUS | Status: AC
Start: 2023-12-17 — End: 2023-12-17
  Administered 2023-12-17: 200 mg via INTRAVENOUS
  Filled 2023-12-17: qty 10

## 2024-02-14 ENCOUNTER — Telehealth: Payer: Self-pay | Admitting: *Deleted

## 2024-02-14 NOTE — Telephone Encounter (Signed)
 The patient called to say that he thinks that he need IV iron . He says that his next appt is in June . He wanted to know if he can get labs and see if he needs iron .

## 2024-02-16 ENCOUNTER — Inpatient Hospital Stay: Attending: Oncology

## 2024-02-16 DIAGNOSIS — D75838 Other thrombocytosis: Secondary | ICD-10-CM | POA: Diagnosis not present

## 2024-02-16 DIAGNOSIS — Z79899 Other long term (current) drug therapy: Secondary | ICD-10-CM | POA: Diagnosis not present

## 2024-02-16 DIAGNOSIS — D509 Iron deficiency anemia, unspecified: Secondary | ICD-10-CM | POA: Insufficient documentation

## 2024-02-16 DIAGNOSIS — Z7982 Long term (current) use of aspirin: Secondary | ICD-10-CM | POA: Diagnosis not present

## 2024-02-16 LAB — IRON AND TIBC
Iron: 39 ug/dL — ABNORMAL LOW (ref 45–182)
Saturation Ratios: 7 % — ABNORMAL LOW (ref 17.9–39.5)
TIBC: 526 ug/dL — ABNORMAL HIGH (ref 250–450)
UIBC: 487 ug/dL

## 2024-02-16 LAB — CBC WITH DIFFERENTIAL/PLATELET
Abs Immature Granulocytes: 0.03 10*3/uL (ref 0.00–0.07)
Basophils Absolute: 0 10*3/uL (ref 0.0–0.1)
Basophils Relative: 0 %
Eosinophils Absolute: 0.1 10*3/uL (ref 0.0–0.5)
Eosinophils Relative: 1 %
HCT: 36.1 % — ABNORMAL LOW (ref 39.0–52.0)
Hemoglobin: 11.8 g/dL — ABNORMAL LOW (ref 13.0–17.0)
Immature Granulocytes: 0 %
Lymphocytes Relative: 14 %
Lymphs Abs: 1.1 10*3/uL (ref 0.7–4.0)
MCH: 28.6 pg (ref 26.0–34.0)
MCHC: 32.7 g/dL (ref 30.0–36.0)
MCV: 87.6 fL (ref 80.0–100.0)
Monocytes Absolute: 0.3 10*3/uL (ref 0.1–1.0)
Monocytes Relative: 4 %
Neutro Abs: 6.8 10*3/uL (ref 1.7–7.7)
Neutrophils Relative %: 81 %
Platelets: 456 10*3/uL — ABNORMAL HIGH (ref 150–400)
RBC: 4.12 MIL/uL — ABNORMAL LOW (ref 4.22–5.81)
RDW: 13.6 % (ref 11.5–15.5)
WBC: 8.4 10*3/uL (ref 4.0–10.5)
nRBC: 0 % (ref 0.0–0.2)

## 2024-02-16 LAB — FERRITIN: Ferritin: 22 ng/mL — ABNORMAL LOW (ref 24–336)

## 2024-02-16 NOTE — Telephone Encounter (Signed)
 I spoke to the patient and I spoke to [facility] and he needs IV iron  and he can have venofer  per insurance and there is an opening for 4/19 at 3:30 for md and then iv iron  after that and patient is good with that.

## 2024-02-22 ENCOUNTER — Inpatient Hospital Stay: Admitting: Oncology

## 2024-02-22 ENCOUNTER — Encounter: Payer: Self-pay | Admitting: Oncology

## 2024-02-22 ENCOUNTER — Inpatient Hospital Stay

## 2024-02-22 VITALS — BP 129/73 | HR 80

## 2024-02-22 VITALS — BP 124/65 | HR 85 | Temp 98.1°F | Resp 16 | Ht 66.0 in | Wt 132.8 lb

## 2024-02-22 DIAGNOSIS — D509 Iron deficiency anemia, unspecified: Secondary | ICD-10-CM

## 2024-02-22 MED ORDER — IRON SUCROSE 20 MG/ML IV SOLN
200.0000 mg | Freq: Once | INTRAVENOUS | Status: AC
  Administered 2024-02-22: 200 mg via INTRAVENOUS
  Filled 2024-02-22: qty 10

## 2024-02-22 MED ORDER — SODIUM CHLORIDE 0.9% FLUSH
10.0000 mL | Freq: Once | INTRAVENOUS | Status: AC
  Administered 2024-02-22: 10 mL via INTRAVENOUS
  Filled 2024-02-22: qty 10

## 2024-02-22 NOTE — Progress Notes (Signed)
 [facility]  Telephone:(336) [phone] Fax:(336) [phone]  ID: AVEYON NURSE OB: 01/30/1956  MR#: [id number]  QMV#:[id number]  Patient Care Team: Little Riff, MD as PCP - General (Internal Medicine) Shellie Dials, MD as Consulting Physician (Hematology and Oncology)  CHIEF COMPLAINT: Iron  deficiency anemia.  INTERVAL HISTORY: Patient returns to clinic today as an add-on with complaints of significant fatigue.  He otherwise feels well.  He has no neurologic complaints.  He denies any recent fevers or illnesses.  He has no chest pain, shortness of breath, cough, or hemoptysis.  He denies any nausea, vomiting, constipation, or diarrhea.  He denies any melena or hematochezia.  He has no urinary complaints.  Patient offers no further specific complaints today.  REVIEW OF SYSTEMS:   Review of Systems  Constitutional:  Positive for malaise/fatigue. Negative for fever and weight loss.  Respiratory: Negative.  Negative for cough, hemoptysis and shortness of breath.   Cardiovascular: Negative.  Negative for chest pain and leg swelling.  Gastrointestinal: Negative.  Negative for abdominal pain, blood in stool and melena.  Genitourinary: Negative.  Negative for hematuria.  Musculoskeletal: Negative.  Negative for back pain.  Skin: Negative.  Negative for rash.  Neurological: Negative.  Negative for dizziness, focal weakness, weakness and headaches.  Psychiatric/Behavioral: Negative.  The patient is not nervous/anxious.     As per HPI. Otherwise, a complete review of systems is negative.  PAST MEDICAL HISTORY: Past Medical History:  Diagnosis Date   Esophageal stricture    Hyperlipidemia    Hypertension    Kidney stones     PAST SURGICAL HISTORY: Past Surgical History:  Procedure Laterality Date   COLONOSCOPY     COLONOSCOPY WITH PROPOFOL  N/A 10/10/2015   Procedure: COLONOSCOPY WITH PROPOFOL ;  Surgeon: Stephens Eis, MD;  Location: [facility] ENDOSCOPY;  Service:  Gastroenterology;  Laterality: N/A;   ESOPHAGOGASTRODUODENOSCOPY     ESOPHAGOGASTRODUODENOSCOPY (EGD) WITH PROPOFOL  N/A 10/10/2015   Procedure: ESOPHAGOGASTRODUODENOSCOPY (EGD) WITH PROPOFOL ;  Surgeon: Stephens Eis, MD;  Location: [facility] ENDOSCOPY;  Service: Gastroenterology;  Laterality: N/A;    FAMILY HISTORY: Family History  Problem Relation Age of Onset   Hypertension Father    Anemia Father     ADVANCED DIRECTIVES (Y/N):  N  HEALTH MAINTENANCE: Social History   Tobacco Use   Smoking status: Never   Smokeless tobacco: Never  Substance Use Topics   Alcohol use: Not Currently    Alcohol/week: 0.0 standard drinks of alcohol   Drug use: Not Currently     Colonoscopy:  PAP:  Bone density:  Lipid panel:  Allergies  Allergen Reactions   Penicillins     Current Outpatient Medications  Medication Sig Dispense Refill   atorvastatin (LIPITOR) 10 MG tablet Take 40 mg by mouth daily.     cyanocobalamin  (VITAMIN B12) 1000 MCG tablet Take 1,000 mcg by mouth daily.     fluticasone  (FLONASE ) 50 MCG/ACT nasal spray USE 2 SPRAYS IN EACH  NOSTRIL DAILY 48 g 3   lisinopril (ZESTRIL) 20 MG tablet Take 20 mg by mouth daily.     tamsulosin (FLOMAX) 0.4 MG CAPS capsule Take by mouth.     aspirin EC 81 MG tablet Take 81 mg by mouth daily. Swallow whole. (Patient not taking: Reported on 02/22/2024)     cyanocobalamin  (,VITAMIN B-12,) 1000 MCG/ML injection Inject 1 ml (1000 units) every 30 days (Patient not taking: Reported on 02/22/2024) 3 mL 3   Cyanocobalamin  1000 MCG/ML KIT Inject 1 Dose as  directed every 30 (thirty) days. (Patient not taking: Reported on 05/07/2023) 1 kit 12   gemfibrozil (LOPID) 600 MG tablet Take 600 mg by mouth 2 (two) times daily before a meal. (Patient not taking: Reported on 05/07/2023)     Syringe/Needle, Disp, (SYRINGE 3CC/25GX1") 25G X 1" 3 ML MISC Use to inject Cyanocobalamin  every 30 days (Patient not taking: Reported on 02/22/2024) 3 each 3   No current  facility-administered medications for this visit.   Facility-Administered Medications Ordered in Other Visits  Medication Dose Route Frequency Provider Last Rate Last Admin   0.9 %  sodium chloride  infusion   Intravenous Continuous Jacquelina Hewins J, MD   Stopped at 05/19/23 1356   iron  sucrose (VENOFER ) injection 200 mg  200 mg Intravenous Once Ndrew Creason J, MD       sodium chloride  flush (NS) 0.9 % injection 10 mL  10 mL Intravenous Once Britteny Fiebelkorn J, MD        OBJECTIVE: Vitals:   02/22/24 1525  BP: 124/65  Pulse: 85  Resp: 16  Temp: 98.1 F (36.7 C)  SpO2: 100%     Body mass index is 21.43 kg/m.    ECOG FS:0 - Asymptomatic  General: Well-developed, well-nourished, no acute distress. Eyes: Pink conjunctiva, anicteric sclera. HEENT: Normocephalic, moist mucous membranes. Lungs: No audible wheezing or coughing. Heart: Regular rate and rhythm. Abdomen: Soft, nontender, no obvious distention. Musculoskeletal: No edema, cyanosis, or clubbing. Neuro: Alert, answering all questions appropriately. Cranial nerves grossly intact. Skin: No rashes or petechiae noted. Psych: Normal affect.  LAB RESULTS:  Lab Results  Component Value Date   NA 138 12/18/2019   K 3.9 12/18/2019   CL 104 12/18/2019   CO2 28 12/18/2019   GLUCOSE 70 03/12/2021   BUN 17 12/18/2019   CREATININE 0.93 12/18/2019   CALCIUM 9.5 12/18/2019   PROT 7.9 12/18/2019   ALBUMIN 4.3 12/18/2019   AST 18 12/18/2019   ALT 18 12/18/2019   ALKPHOS 73 12/18/2019   BILITOT 0.7 12/18/2019   GFRNONAA >60 12/18/2019   GFRAA >60 12/18/2019    Lab Results  Component Value Date   WBC 8.4 02/16/2024   NEUTROABS 6.8 02/16/2024   HGB 11.8 (L) 02/16/2024   HCT 36.1 (L) 02/16/2024   MCV 87.6 02/16/2024   PLT 456 (H) 02/16/2024   Lab Results  Component Value Date   IRON  39 (L) 02/16/2024   TIBC 526 (H) 02/16/2024   IRONPCTSAT 7 (L) 02/16/2024   Lab Results  Component Value Date   FERRITIN 22 (L)  02/16/2024     STUDIES: No results found.   ASSESSMENT: Iron  deficiency anemia.  PLAN:    Iron  deficiency anemia: Patient's hemoglobin is only mildly reduced at 11.8, but his total iron , iron  saturation, and ferritin levels have all declined.  He is also symptomatic.  Patient had EGD and video endoscopy at [facility] in March 2025 patient reports no significant pathology.  Proceed with 200 mg IV Venofer  today.  Return to clinic 3 times over the next 1 to 2 weeks for additional treatment.  Patient with then return to clinic in 3 months with repeat laboratory work, further evaluation, and continuation of treatment if needed.   Thrombocytosis: Secondary to iron  deficiency.  Monitor.   I spent a total of 30 minutes reviewing chart data, face-to-face evaluation with the patient, counseling and coordination of care as detailed above.   Patient expressed understanding and was in agreement with this plan. He also understands  that He can call clinic at any time with any questions, concerns, or complaints.     Shellie Dials, MD   02/22/2024 4:00 PM

## 2024-02-22 NOTE — Progress Notes (Signed)
Patient tolerated Venofer infusion well. Explained recommendation of 30 min post monitoring. Patient refused to wait post monitoring. Educated on what signs to watch for & to call with any concerns. No questions, discharged. Stable  

## 2024-02-22 NOTE — Patient Instructions (Signed)

## 2024-02-29 ENCOUNTER — Inpatient Hospital Stay: Attending: Oncology

## 2024-02-29 VITALS — BP 115/65 | HR 82 | Temp 97.8°F | Resp 16

## 2024-02-29 DIAGNOSIS — D509 Iron deficiency anemia, unspecified: Secondary | ICD-10-CM | POA: Insufficient documentation

## 2024-02-29 DIAGNOSIS — E538 Deficiency of other specified B group vitamins: Secondary | ICD-10-CM

## 2024-02-29 LAB — VITAMIN B12: Vitamin B-12: 787 pg/mL (ref 180–914)

## 2024-02-29 MED ORDER — IRON SUCROSE 20 MG/ML IV SOLN
200.0000 mg | Freq: Once | INTRAVENOUS | Status: AC
  Administered 2024-02-29: 200 mg via INTRAVENOUS
  Filled 2024-02-29: qty 10

## 2024-03-07 ENCOUNTER — Inpatient Hospital Stay

## 2024-03-07 VITALS — BP 123/54 | HR 85 | Temp 98.2°F | Resp 18

## 2024-03-07 DIAGNOSIS — D509 Iron deficiency anemia, unspecified: Secondary | ICD-10-CM | POA: Diagnosis not present

## 2024-03-07 MED ORDER — IRON SUCROSE 20 MG/ML IV SOLN
200.0000 mg | Freq: Once | INTRAVENOUS | Status: AC
Start: 2024-03-07 — End: 2024-03-07
  Administered 2024-03-07: 200 mg via INTRAVENOUS

## 2024-03-07 MED ORDER — SODIUM CHLORIDE 0.9% FLUSH
3.0000 mL | Freq: Once | INTRAVENOUS | Status: AC | PRN
  Administered 2024-03-07: 3 mL
  Filled 2024-03-07: qty 3

## 2024-03-14 ENCOUNTER — Inpatient Hospital Stay

## 2024-03-14 ENCOUNTER — Encounter (INDEPENDENT_AMBULATORY_CARE_PROVIDER_SITE_OTHER): Payer: Self-pay

## 2024-03-14 VITALS — BP 116/64 | HR 80 | Resp 18

## 2024-03-14 DIAGNOSIS — D509 Iron deficiency anemia, unspecified: Secondary | ICD-10-CM

## 2024-03-14 MED ORDER — IRON SUCROSE 20 MG/ML IV SOLN
200.0000 mg | Freq: Once | INTRAVENOUS | Status: AC
  Administered 2024-03-14: 200 mg via INTRAVENOUS
  Filled 2024-03-14: qty 10

## 2024-03-14 NOTE — Patient Instructions (Signed)

## 2024-03-29 ENCOUNTER — Other Ambulatory Visit: Payer: Medicare HMO

## 2024-03-30 ENCOUNTER — Ambulatory Visit: Payer: Medicare HMO

## 2024-03-30 ENCOUNTER — Ambulatory Visit: Payer: Medicare HMO | Admitting: Oncology

## 2024-04-21 ENCOUNTER — Telehealth: Payer: Self-pay

## 2024-04-21 NOTE — Telephone Encounter (Signed)
 Patient called stating he feels like he needs a labs drawn sooner then July. Request call back with lab appointment at [phone]

## 2024-04-24 ENCOUNTER — Telehealth: Payer: Self-pay

## 2024-04-24 NOTE — Telephone Encounter (Signed)
 Patient called informing team labs were drawn at PCP's office today(ferritin & Iron ). They are visible in mychart so lab appointment cancelled per patient request. Patient aware of MD/infusion apt

## 2024-04-27 ENCOUNTER — Other Ambulatory Visit

## 2024-05-01 ENCOUNTER — Inpatient Hospital Stay: Attending: Oncology | Admitting: Oncology

## 2024-05-01 ENCOUNTER — Inpatient Hospital Stay

## 2024-05-01 ENCOUNTER — Encounter: Payer: Self-pay | Admitting: Oncology

## 2024-05-01 VITALS — BP 106/58 | HR 78 | Temp 98.4°F | Resp 18 | Ht 66.0 in | Wt 132.0 lb

## 2024-05-01 VITALS — BP 109/50 | HR 82 | Resp 18

## 2024-05-01 DIAGNOSIS — D509 Iron deficiency anemia, unspecified: Secondary | ICD-10-CM | POA: Diagnosis present

## 2024-05-01 MED ORDER — IRON SUCROSE 20 MG/ML IV SOLN
200.0000 mg | Freq: Once | INTRAVENOUS | Status: AC
  Administered 2024-05-01: 200 mg via INTRAVENOUS

## 2024-05-01 NOTE — Progress Notes (Signed)
 Patient says that this go around for his iron  infusions didn't last but for almost a month, he feels like his condition is getting worse.

## 2024-05-01 NOTE — Progress Notes (Signed)
 [facility]  Telephone:(336) [phone] Fax:(336) [phone]  ID: DARLY FAILS OB: 09/01/1956  MR#: [id number]  RDW#:[id number]  Patient Care Team: Rudolpho Norleen BIRCH, MD as PCP - General (Internal Medicine) Jacobo Evalene PARAS, MD as Consulting Physician (Hematology and Oncology)  CHIEF COMPLAINT: Iron  deficiency anemia.  INTERVAL HISTORY: Patient returns to clinic today for repeat laboratory, further evaluation, and continuation of IV iron .  He has noticed increased fatigue over the past several weeks, but otherwise has felt well. He has no neurologic complaints.  He denies any recent fevers or illnesses.  He has no chest pain, shortness of breath, cough, or hemoptysis.  He denies any nausea, vomiting, constipation, or diarrhea.  He denies any melena or hematochezia.  He has no urinary complaints.  Patient offers no further specific complaints today.  REVIEW OF SYSTEMS:   Review of Systems  Constitutional:  Positive for malaise/fatigue. Negative for fever and weight loss.  Respiratory: Negative.  Negative for cough, hemoptysis and shortness of breath.   Cardiovascular: Negative.  Negative for chest pain and leg swelling.  Gastrointestinal: Negative.  Negative for abdominal pain, blood in stool and melena.  Genitourinary: Negative.  Negative for hematuria.  Musculoskeletal: Negative.  Negative for back pain.  Skin: Negative.  Negative for rash.  Neurological: Negative.  Negative for dizziness, focal weakness, weakness and headaches.  Psychiatric/Behavioral: Negative.  The patient is not nervous/anxious.     As per HPI. Otherwise, a complete review of systems is negative.  PAST MEDICAL HISTORY: Past Medical History:  Diagnosis Date   Esophageal stricture    Hyperlipidemia    Hypertension    Kidney stones     PAST SURGICAL HISTORY: Past Surgical History:  Procedure Laterality Date   COLONOSCOPY     COLONOSCOPY WITH PROPOFOL  N/A 10/10/2015   Procedure:  COLONOSCOPY WITH PROPOFOL ;  Surgeon: Deward CINDERELLA Piedmont, MD;  Location: [facility] ENDOSCOPY;  Service: Gastroenterology;  Laterality: N/A;   ESOPHAGOGASTRODUODENOSCOPY     ESOPHAGOGASTRODUODENOSCOPY (EGD) WITH PROPOFOL  N/A 10/10/2015   Procedure: ESOPHAGOGASTRODUODENOSCOPY (EGD) WITH PROPOFOL ;  Surgeon: Deward CINDERELLA Piedmont, MD;  Location: [facility] ENDOSCOPY;  Service: Gastroenterology;  Laterality: N/A;    FAMILY HISTORY: Family History  Problem Relation Age of Onset   Hypertension Father    Anemia Father     ADVANCED DIRECTIVES (Y/N):  N  HEALTH MAINTENANCE: Social History   Tobacco Use   Smoking status: Never   Smokeless tobacco: Never  Substance Use Topics   Alcohol use: Not Currently    Alcohol/week: 0.0 standard drinks of alcohol   Drug use: Not Currently     Colonoscopy:  PAP:  Bone density:  Lipid panel:  Allergies  Allergen Reactions   Penicillins     Current Outpatient Medications  Medication Sig Dispense Refill   atorvastatin (LIPITOR) 10 MG tablet Take 40 mg by mouth daily.     fluticasone  (FLONASE ) 50 MCG/ACT nasal spray USE 2 SPRAYS IN EACH  NOSTRIL DAILY 48 g 3   lisinopril (ZESTRIL) 20 MG tablet Take 20 mg by mouth daily.     tamsulosin (FLOMAX) 0.4 MG CAPS capsule Take by mouth.     aspirin EC 81 MG tablet Take 81 mg by mouth daily. Swallow whole. (Patient not taking: Reported on 05/01/2024)     cyanocobalamin  (,VITAMIN B-12,) 1000 MCG/ML injection Inject 1 ml (1000 units) every 30 days (Patient not taking: Reported on 05/01/2024) 3 mL 3   Cyanocobalamin  1000 MCG/ML KIT Inject 1 Dose as directed every 30 (thirty)  days. (Patient not taking: Reported on 05/01/2024) 1 kit 12   gemfibrozil (LOPID) 600 MG tablet Take 600 mg by mouth 2 (two) times daily before a meal. (Patient not taking: Reported on 05/01/2024)     Syringe/Needle, Disp, (SYRINGE 3CC/25GX1) 25G X 1 3 ML MISC Use to inject Cyanocobalamin  every 30 days (Patient not taking: Reported on 05/01/2024) 3 each 3   No current  facility-administered medications for this visit.   Facility-Administered Medications Ordered in Other Visits  Medication Dose Route Frequency Provider Last Rate Last Admin   0.9 %  sodium chloride  infusion   Intravenous Continuous Jacobo Evalene PARAS, MD   Stopped at 05/19/23 1356    OBJECTIVE: Vitals:   05/01/24 1404  BP: (!) 106/58  Pulse: 78  Resp: 18  Temp: 98.4 F (36.9 C)  SpO2: 100%     Body mass index is 21.31 kg/m.    ECOG FS:0 - Asymptomatic  General: Well-developed, well-nourished, no acute distress. Eyes: Pink conjunctiva, anicteric sclera. HEENT: Normocephalic, moist mucous membranes. Lungs: No audible wheezing or coughing. Heart: Regular rate and rhythm. Abdomen: Soft, nontender, no obvious distention. Musculoskeletal: No edema, cyanosis, or clubbing. Neuro: Alert, answering all questions appropriately. Cranial nerves grossly intact. Skin: No rashes or petechiae noted. Psych: Normal affect.  LAB RESULTS:  Lab Results  Component Value Date   NA 138 12/18/2019   K 3.9 12/18/2019   CL 104 12/18/2019   CO2 28 12/18/2019   GLUCOSE 70 03/12/2021   BUN 17 12/18/2019   CREATININE 0.93 12/18/2019   CALCIUM 9.5 12/18/2019   PROT 7.9 12/18/2019   ALBUMIN 4.3 12/18/2019   AST 18 12/18/2019   ALT 18 12/18/2019   ALKPHOS 73 12/18/2019   BILITOT 0.7 12/18/2019   GFRNONAA >60 12/18/2019   GFRAA >60 12/18/2019    Lab Results  Component Value Date   WBC 8.4 02/16/2024   NEUTROABS 6.8 02/16/2024   HGB 11.8 (L) 02/16/2024   HCT 36.1 (L) 02/16/2024   MCV 87.6 02/16/2024   PLT 456 (H) 02/16/2024   Lab Results  Component Value Date   IRON  39 (L) 02/16/2024   TIBC 526 (H) 02/16/2024   IRONPCTSAT 7 (L) 02/16/2024   Lab Results  Component Value Date   FERRITIN 22 (L) 02/16/2024     STUDIES: No results found.   ASSESSMENT: Iron  deficiency anemia.  PLAN:    Iron  deficiency anemia: Patient's hemoglobin and iron  stores have trended down and he is more  symptomatic.  He had EGD and video endoscopy at [facility] in March 2025 patient reports no significant pathology.  Plan is to video endoscopy in August 2025.  Proceed with 200 mg IV Venofer  today.  Return to clinic 3 times over the next 1 to 2 weeks for additional treatment.  Patient then return to clinic in 2 months with repeat laboratory work, further evaluation, and continuation of treatment if necessary.   Thrombocytosis: Resolved.  I spent a total of 30 minutes reviewing chart data, face-to-face evaluation with the patient, counseling and coordination of care as detailed above.    Patient expressed understanding and was in agreement with this plan. He also understands that He can call clinic at any time with any questions, concerns, or complaints.     Evalene PARAS Jacobo, MD   05/01/2024 3:37 PM

## 2024-05-01 NOTE — Patient Instructions (Signed)

## 2024-05-03 ENCOUNTER — Inpatient Hospital Stay

## 2024-05-03 VITALS — BP 110/51 | HR 82 | Temp 96.0°F | Resp 18

## 2024-05-03 DIAGNOSIS — D509 Iron deficiency anemia, unspecified: Secondary | ICD-10-CM | POA: Diagnosis not present

## 2024-05-03 MED ORDER — IRON SUCROSE 20 MG/ML IV SOLN
200.0000 mg | Freq: Once | INTRAVENOUS | Status: AC
Start: 2024-05-03 — End: 2024-05-03
  Administered 2024-05-03: 200 mg via INTRAVENOUS

## 2024-05-08 ENCOUNTER — Inpatient Hospital Stay

## 2024-05-08 VITALS — BP 112/56 | HR 87 | Resp 18

## 2024-05-08 DIAGNOSIS — D509 Iron deficiency anemia, unspecified: Secondary | ICD-10-CM | POA: Diagnosis not present

## 2024-05-08 MED ORDER — IRON SUCROSE 20 MG/ML IV SOLN
200.0000 mg | Freq: Once | INTRAVENOUS | Status: AC
Start: 2024-05-08 — End: 2024-05-08
  Administered 2024-05-08: 200 mg via INTRAVENOUS
  Filled 2024-05-08: qty 10

## 2024-05-11 ENCOUNTER — Inpatient Hospital Stay

## 2024-05-11 VITALS — BP 110/53 | HR 70 | Temp 97.1°F | Resp 18

## 2024-05-11 DIAGNOSIS — D509 Iron deficiency anemia, unspecified: Secondary | ICD-10-CM | POA: Diagnosis not present

## 2024-05-11 MED ORDER — IRON SUCROSE 20 MG/ML IV SOLN
200.0000 mg | Freq: Once | INTRAVENOUS | Status: AC
Start: 2024-05-11 — End: 2024-05-11
  Administered 2024-05-11: 200 mg via INTRAVENOUS
  Filled 2024-05-11: qty 10

## 2024-05-23 ENCOUNTER — Other Ambulatory Visit

## 2024-05-24 ENCOUNTER — Ambulatory Visit: Admitting: Oncology

## 2024-05-24 ENCOUNTER — Ambulatory Visit

## 2024-05-31 ENCOUNTER — Encounter: Payer: Self-pay | Admitting: Physician Assistant

## 2024-05-31 ENCOUNTER — Ambulatory Visit: Admitting: Physician Assistant

## 2024-05-31 DIAGNOSIS — Z85828 Personal history of other malignant neoplasm of skin: Secondary | ICD-10-CM

## 2024-05-31 DIAGNOSIS — Z1283 Encounter for screening for malignant neoplasm of skin: Secondary | ICD-10-CM

## 2024-05-31 DIAGNOSIS — L821 Other seborrheic keratosis: Secondary | ICD-10-CM | POA: Diagnosis not present

## 2024-05-31 DIAGNOSIS — D1801 Hemangioma of skin and subcutaneous tissue: Secondary | ICD-10-CM

## 2024-05-31 DIAGNOSIS — L814 Other melanin hyperpigmentation: Secondary | ICD-10-CM

## 2024-05-31 DIAGNOSIS — B079 Viral wart, unspecified: Secondary | ICD-10-CM | POA: Diagnosis not present

## 2024-05-31 DIAGNOSIS — D229 Melanocytic nevi, unspecified: Secondary | ICD-10-CM

## 2024-05-31 DIAGNOSIS — W908XXA Exposure to other nonionizing radiation, initial encounter: Secondary | ICD-10-CM

## 2024-05-31 DIAGNOSIS — L570 Actinic keratosis: Secondary | ICD-10-CM | POA: Diagnosis not present

## 2024-05-31 DIAGNOSIS — Z8589 Personal history of malignant neoplasm of other organs and systems: Secondary | ICD-10-CM

## 2024-05-31 DIAGNOSIS — L578 Other skin changes due to chronic exposure to nonionizing radiation: Secondary | ICD-10-CM

## 2024-05-31 DIAGNOSIS — L111 Transient acantholytic dermatosis [Grover]: Secondary | ICD-10-CM | POA: Diagnosis not present

## 2024-05-31 MED ORDER — TRIAMCINOLONE ACETONIDE 0.1 % EX CREA: 1.0000 | TOPICAL_CREAM | Freq: Two times a day (BID) | CUTANEOUS | 2 refills | Status: AC | PRN

## 2024-05-31 NOTE — Progress Notes (Signed)
 New Patient Visit   Subjective  Ryan Bonilla is a 68 y.o. male who presents for the following: Skin Cancer Screening and Full Body Skin Exam.   Prior patient at [facility]. Has history of BCC, SCC and Actinic keratoses.    The patient presents for Total-Body Skin Exam (TBSE) for skin cancer screening and mole check. The patient has spots, moles and lesions to be evaluated, some may be new or changing and the patient may have concern these could be cancer.    The following portions of the chart were reviewed this encounter and updated as appropriate: medications, allergies, medical history  Review of Systems:  No other skin or systemic complaints except as noted in HPI or Assessment and Plan.  Objective  Well appearing patient in no apparent distress; mood and affect are within normal limits.  A full examination was performed including scalp, head, eyes, ears, nose, lips, neck, chest, axillae, abdomen, back, buttocks, bilateral upper extremities, bilateral lower extremities, hands, feet, fingers, toes, fingernails, and toenails. All findings within normal limits unless otherwise noted below.   Relevant physical exam findings are noted in the Assessment and Plan.  Chest - Medial (Center), Head - Anterior (Face) (8), Left Ear, Left Forearm - Posterior, Left Hand - Posterior, Right Ear (3), Right Forearm - Posterior, Right Hand - Posterior, Scalp (3) Erythematous thin papules/macules with gritty scale.  Right Medial Thigh Verrucous papules   Assessment & Plan   SKIN CANCER SCREENING PERFORMED TODAY.  ACTINIC DAMAGE - Chronic condition, secondary to cumulative UV/sun exposure - diffuse scaly erythematous macules with underlying dyspigmentation - Recommend daily broad spectrum sunscreen SPF 30+ to sun-exposed areas, reapply every 2 hours as needed.  - Staying in the shade or wearing long sleeves, sun glasses (UVA+UVB protection) and wide brim hats (4-inch brim around the  entire circumference of the hat) are also recommended for sun protection.  - Call for new or changing lesions.  LENTIGINES, SEBORRHEIC KERATOSES, HEMANGIOMAS - Benign normal skin lesions - Benign-appearing - Call for any changes  MELANOCYTIC NEVI - Tan-brown and/or pink-flesh-colored symmetric macules and papules - Benign appearing on exam today - Observation - Call clinic for new or changing moles - Recommend daily use of broad spectrum spf 30+ sunscreen to sun-exposed areas.    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   Treatment Plan: Triamcinolone  cream to apply BID  HISTORY OF BASAL CELL CARCINOMA   HISTORY OF SQUAMOUS CELL CARCINOMA      AK (ACTINIC KERATOSIS) (20) Chest - Medial (Center), Head - Anterior (Face) (8), Left Ear, Left Forearm - Posterior, Left Hand - Posterior, Right Ear (3), Right Forearm - Posterior, Right Hand - Posterior, Scalp (3) Destruction of lesion - Head - Anterior (Face) (8), Left Ear, Right Ear (3) Complexity: simple   Destruction method: cryotherapy   Informed consent: discussed and consent obtained   Timeout:  patient name, date of birth, surgical site, and procedure verified Lesion destroyed using liquid nitrogen: Yes   Region frozen until ice ball extended beyond lesion: Yes   Outcome: patient tolerated procedure well with no complications   Post-procedure details: wound care instructions given    VIRAL WARTS, UNSPECIFIED TYPE Right Medial Thigh Destruction of lesion - Right Medial Thigh Complexity: simple   Destruction method: cryotherapy   Informed consent: discussed and consent obtained   Timeout:  patient name, date of birth, surgical site, and procedure verified Lesion destroyed using liquid nitrogen: Yes   Region frozen until  ice ball extended beyond lesion: Yes   Outcome: patient tolerated procedure well with no complications   Post-procedure details: wound care instructions given    GROVER'S DISEASE   SCREENING EXAM  FOR SKIN CANCER   LENTIGINES   SEBORRHEIC KERATOSIS   MULTIPLE BENIGN NEVI   CHERRY ANGIOMA   ACTINIC SKIN DAMAGE   HISTORY OF BASAL CELL CANCER   HISTORY OF SQUAMOUS CELL CARCINOMA    Return in about 6 months (around 12/01/2024) for ak follow up.  I, Gordan Beams, CMA, am acting as scribe for Leili Eskenazi K, PA-C.   Documentation: I have reviewed the above documentation for accuracy and completeness, and I agree with the above.  Maguire Killmer K, PA-C

## 2024-07-03 ENCOUNTER — Inpatient Hospital Stay: Attending: Oncology

## 2024-07-03 DIAGNOSIS — D509 Iron deficiency anemia, unspecified: Secondary | ICD-10-CM | POA: Insufficient documentation

## 2024-07-03 LAB — CBC WITH DIFFERENTIAL/PLATELET
Abs Immature Granulocytes: 0.03 K/uL (ref 0.00–0.07)
Basophils Absolute: 0 K/uL (ref 0.0–0.1)
Basophils Relative: 0 %
Eosinophils Absolute: 0.1 K/uL (ref 0.0–0.5)
Eosinophils Relative: 1 %
HCT: 36.6 % — ABNORMAL LOW (ref 39.0–52.0)
Hemoglobin: 12.1 g/dL — ABNORMAL LOW (ref 13.0–17.0)
Immature Granulocytes: 0 %
Lymphocytes Relative: 19 %
Lymphs Abs: 1.7 K/uL (ref 0.7–4.0)
MCH: 28.2 pg (ref 26.0–34.0)
MCHC: 33.1 g/dL (ref 30.0–36.0)
MCV: 85.3 fL (ref 80.0–100.0)
Monocytes Absolute: 0.5 K/uL (ref 0.1–1.0)
Monocytes Relative: 5 %
Neutro Abs: 6.7 K/uL (ref 1.7–7.7)
Neutrophils Relative %: 75 %
Platelets: 368 K/uL (ref 150–400)
RBC: 4.29 MIL/uL (ref 4.22–5.81)
RDW: 15.2 % (ref 11.5–15.5)
WBC: 9 K/uL (ref 4.0–10.5)
nRBC: 0 % (ref 0.0–0.2)

## 2024-07-03 LAB — FERRITIN: Ferritin: 23 ng/mL — ABNORMAL LOW (ref 24–336)

## 2024-07-03 LAB — IRON AND TIBC
Iron: 28 ug/dL — ABNORMAL LOW (ref 45–182)
Saturation Ratios: 6 % — ABNORMAL LOW (ref 17.9–39.5)
TIBC: 483 ug/dL — ABNORMAL HIGH (ref 250–450)
UIBC: 455 ug/dL

## 2024-07-04 ENCOUNTER — Inpatient Hospital Stay

## 2024-07-04 ENCOUNTER — Encounter: Payer: Self-pay | Admitting: Oncology

## 2024-07-04 ENCOUNTER — Inpatient Hospital Stay: Admitting: Oncology

## 2024-07-04 VITALS — BP 124/68 | HR 68 | Temp 98.1°F | Resp 16 | Wt 134.0 lb

## 2024-07-04 VITALS — BP 125/71 | HR 85 | Resp 16

## 2024-07-04 DIAGNOSIS — D509 Iron deficiency anemia, unspecified: Secondary | ICD-10-CM

## 2024-07-04 DIAGNOSIS — E538 Deficiency of other specified B group vitamins: Secondary | ICD-10-CM

## 2024-07-04 MED ORDER — IRON SUCROSE 20 MG/ML IV SOLN
200.0000 mg | Freq: Once | INTRAVENOUS | Status: AC
  Administered 2024-07-04: 200 mg via INTRAVENOUS
  Filled 2024-07-04: qty 10

## 2024-07-04 NOTE — Progress Notes (Unsigned)
  [facility]  Telephone:(336) [phone] Fax:(336) [phone]  ID: SEBASTIAN LURZ OB: Feb 06, 1956  MR#: [id number]  RDW#:[id number]  Patient Care Team: Rudolpho Norleen BIRCH, MD as PCP - General (Internal Medicine) Jacobo Evalene PARAS, MD as Consulting Physician (Hematology and Oncology)  CHIEF COMPLAINT: Iron  deficiency anemia.  INTERVAL HISTORY: Patient returns to clinic today for repeat laboratory, further evaluation, and continuation of IV iron .  He has noticed increased fatigue over the past several weeks, but otherwise has felt well. He has no neurologic complaints.  He denies any recent fevers or illnesses.  He has no chest pain, shortness of breath, cough, or hemoptysis.  He denies any nausea, vomiting, constipation, or diarrhea.  He denies any melena or hematochezia.  He has no urinary complaints.  Patient offers no further specific complaints today.  REVIEW OF SYSTEMS:   Review of Systems  Constitutional:  Positive for malaise/fatigue. Negative for fever and weight loss.  Respiratory: Negative.  Negative for cough, hemoptysis and shortness of breath.   Cardiovascular: Negative.  Negative for chest pain and leg swelling.  Gastrointestinal: Negative.  Negative for abdominal pain, blood in stool and melena.  Genitourinary: Negative.  Negative for hematuria.  Musculoskeletal: Negative.  Negative for back pain.  Skin: Negative.  Negative for rash.  Neurological: Negative.  Negative for dizziness, focal weakness, weakness and headaches.  Psychiatric/Behavioral: Negative.  The patient is not nervous/anxious.     As per HPI. Otherwise, a complete review of systems is negative.  PAST MEDICAL HISTORY: Past Medical History:  Diagnosis Date  . Esophageal stricture   . Hyperlipidemia   . Hypertension   . Kidney stones     PAST SURGICAL HISTORY: Past Surgical History:  Procedure Laterality Date  . COLONOSCOPY    . COLONOSCOPY WITH PROPOFOL  N/A 10/10/2015   Procedure:  COLONOSCOPY WITH PROPOFOL ;  Surgeon: Deward CINDERELLA Piedmont, MD;  Location: [facility] ENDOSCOPY;  Service: Gastroenterology;  Laterality: N/A;  . ESOPHAGOGASTRODUODENOSCOPY    . ESOPHAGOGASTRODUODENOSCOPY (EGD) WITH PROPOFOL  N/A 10/10/2015   Procedure: ESOPHAGOGASTRODUODENOSCOPY (EGD) WITH PROPOFOL ;  Surgeon: Deward CINDERELLA Piedmont, MD;  Location: [facility] ENDOSCOPY;  Service: Gastroenterology;  Laterality: N/A;    FAMILY HISTORY: Family History  Problem Relation Age of Onset  . Hypertension Father   . Anemia Father     ADVANCED DIRECTIVES (Y/N):  N  HEALTH MAINTENANCE: Social History   Tobacco Use  . Smoking status: Never  . Smokeless tobacco: Never  Substance Use Topics  . Alcohol use: Not Currently    Alcohol/week: 0.0 standard drinks of alcohol  . Drug use: Not Currently     Colonoscopy:  PAP:  Bone density:  Lipid panel:  Allergies  Allergen Reactions  . Penicillins     Current Outpatient Medications  Medication Sig Dispense Refill  . aspirin EC 81 MG tablet Take 81 mg by mouth daily. Swallow whole. (Patient not taking: Reported on 05/01/2024)    . atorvastatin (LIPITOR) 10 MG tablet Take 40 mg by mouth daily.    . cyanocobalamin  (,VITAMIN B-12,) 1000 MCG/ML injection Inject 1 ml (1000 units) every 30 days (Patient not taking: Reported on 05/01/2024) 3 mL 3  . Cyanocobalamin  1000 MCG/ML KIT Inject 1 Dose as directed every 30 (thirty) days. (Patient not taking: Reported on 05/01/2024) 1 kit 12  . fluticasone  (FLONASE ) 50 MCG/ACT nasal spray USE 2 SPRAYS IN EACH  NOSTRIL DAILY 48 g 3  . gemfibrozil (LOPID) 600 MG tablet Take 600 mg by mouth 2 (two) times daily before a  meal. (Patient not taking: Reported on 05/01/2024)    . lisinopril (ZESTRIL) 20 MG tablet Take 20 mg by mouth daily.    . Syringe/Needle, Disp, (SYRINGE 3CC/25GX1) 25G X 1 3 ML MISC Use to inject Cyanocobalamin  every 30 days (Patient not taking: Reported on 05/01/2024) 3 each 3  . tamsulosin (FLOMAX) 0.4 MG CAPS capsule Take by mouth.    .  triamcinolone  cream (KENALOG ) 0.1 % Apply 1 Application topically 2 (two) times daily as needed (Rash). 453.6 g 2   No current facility-administered medications for this visit.   Facility-Administered Medications Ordered in Other Visits  Medication Dose Route Frequency Provider Last Rate Last Admin  . 0.9 %  sodium chloride  infusion   Intravenous Continuous Jacobo Evalene PARAS, MD   Stopped at 05/19/23 1356    OBJECTIVE: Vitals:   07/04/24 1507  BP: 124/68  Pulse: 68  Resp: 16  Temp: 98.1 F (36.7 C)  SpO2: 98%     Body mass index is 21.63 kg/m.    ECOG FS:0 - Asymptomatic  General: Well-developed, well-nourished, no acute distress. Eyes: Pink conjunctiva, anicteric sclera. HEENT: Normocephalic, moist mucous membranes. Lungs: No audible wheezing or coughing. Heart: Regular rate and rhythm. Abdomen: Soft, nontender, no obvious distention. Musculoskeletal: No edema, cyanosis, or clubbing. Neuro: Alert, answering all questions appropriately. Cranial nerves grossly intact. Skin: No rashes or petechiae noted. Psych: Normal affect.  LAB RESULTS:  Lab Results  Component Value Date   NA 138 12/18/2019   K 3.9 12/18/2019   CL 104 12/18/2019   CO2 28 12/18/2019   GLUCOSE 70 03/12/2021   BUN 17 12/18/2019   CREATININE 0.93 12/18/2019   CALCIUM 9.5 12/18/2019   PROT 7.9 12/18/2019   ALBUMIN 4.3 12/18/2019   AST 18 12/18/2019   ALT 18 12/18/2019   ALKPHOS 73 12/18/2019   BILITOT 0.7 12/18/2019   GFRNONAA >60 12/18/2019   GFRAA >60 12/18/2019    Lab Results  Component Value Date   WBC 9.0 07/03/2024   NEUTROABS 6.7 07/03/2024   HGB 12.1 (L) 07/03/2024   HCT 36.6 (L) 07/03/2024   MCV 85.3 07/03/2024   PLT 368 07/03/2024   Lab Results  Component Value Date   IRON  28 (L) 07/03/2024   TIBC 483 (H) 07/03/2024   IRONPCTSAT 6 (L) 07/03/2024   Lab Results  Component Value Date   FERRITIN 23 (L) 07/03/2024     STUDIES: No results found.   ASSESSMENT: Iron   deficiency anemia.  PLAN:    Iron  deficiency anemia: Patient's hemoglobin and iron  stores have trended down and he is more symptomatic.  He had EGD and video endoscopy at [facility] in March 2025 patient reports no significant pathology.  Plan is to video endoscopy in August 2025.  Proceed with 200 mg IV Venofer  today.  Return to clinic 3 times over the next 1 to 2 weeks for additional treatment.  Patient then return to clinic in 2 months with repeat laboratory work, further evaluation, and continuation of treatment if necessary.   Thrombocytosis: Resolved.  I spent a total of 30 minutes reviewing chart data, face-to-face evaluation with the patient, counseling and coordination of care as detailed above.    Patient expressed understanding and was in agreement with this plan. He also understands that He can call clinic at any time with any questions, concerns, or complaints.     Evalene PARAS Jacobo, MD   07/04/2024 3:22 PM

## 2024-07-04 NOTE — Patient Instructions (Signed)

## 2024-07-05 ENCOUNTER — Encounter: Payer: Self-pay | Admitting: Oncology

## 2024-07-10 ENCOUNTER — Inpatient Hospital Stay

## 2024-07-10 VITALS — BP 132/71 | HR 81 | Temp 98.3°F | Resp 16

## 2024-07-10 DIAGNOSIS — D509 Iron deficiency anemia, unspecified: Secondary | ICD-10-CM

## 2024-07-10 MED ORDER — IRON SUCROSE 20 MG/ML IV SOLN
200.0000 mg | Freq: Once | INTRAVENOUS | Status: AC
  Administered 2024-07-10: 200 mg via INTRAVENOUS
  Filled 2024-07-10: qty 10

## 2024-07-10 NOTE — Patient Instructions (Signed)

## 2024-07-12 ENCOUNTER — Inpatient Hospital Stay

## 2024-07-12 VITALS — BP 139/72 | HR 79 | Temp 96.8°F | Resp 16

## 2024-07-12 DIAGNOSIS — D509 Iron deficiency anemia, unspecified: Secondary | ICD-10-CM

## 2024-07-12 MED ORDER — IRON SUCROSE 20 MG/ML IV SOLN
200.0000 mg | Freq: Once | INTRAVENOUS | Status: AC
  Administered 2024-07-12: 200 mg via INTRAVENOUS
  Filled 2024-07-12: qty 10

## 2024-07-13 NOTE — H&P (Signed)
 GI PRE-PROCEDURE HISTORY AND PHYSICAL   [name] presents for the following scheduled GI procedure(s):  Antegrade Device Assisted Enteroscopy  Scheduled Procedure(s): Antegrade Device Assised Enteroscopy.   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     I have reviewed the patient's history and physical and I have re-examined the patient prior to the procedure. There have been no significant recent changes in the patient's medical status.  History: Past Medical History:  Diagnosis Date  . Esophageal stricture    benign, follows with GI for periodic dilation  . Hyperlipidemia   . Hypertension   . Hypertriglyceridemia   . Kidney stones   . Tubular adenoma of colon 07/25/2007    Past Surgical History:  Procedure Laterality Date  . COLONOSCOPY  09/08/2010   07/25/2007  . EGD  01/18/2013   09/08/2010  . COLONOSCOPY  10/10/2015   Entire examined colon is normal/PHx of colon polyps/Repeat 56yrs/PYO  . EGD  10/10/2015   Normal EGD/No Repeat/PYO  . COLONOSCOPY  11/05/2017   Tubular adenoma of the colon/Repeat 20yrs/TKT  . EGD  11/05/2017   Gastritis/No Repeat/TKT  . EGD  12/06/2019   Gastritis/Esophagitis/No Repeat/TKT  . COLONOSCOPY  08/29/2020   Normal colon/PHx CP/Repeat 35yrs/CTL  . EGD  08/29/2020   Gastritis/Repeat as needed/CTL  . LAPAROSCOPY DIAGNOSTIC N/A 09/10/2022   Procedure: LAPAROSCOPY, SURGICAL; ABDOMEN, PERITONEUM, AND OMENTUM, DIAGNOSTIC, WITH OR WITHOUT COLLECTION OF SPECIMEN(S) BY BRUSHING OR WASHING (SEPARATE PROCEDURE);  Surgeon: Lethaniel Greig Cowper, MD;  Location: [facility] OR;  Service: General Surgery;  Laterality: N/A;  . COLONOSCOPY N/A 04/05/2023   Procedure: Colonoscopy;  Surgeon: Tobie Ronita Burkitt, MD;  Location: [facility];  Service: Gastroenterology;  Laterality: N/A;  . COLONOSCOPY W/REMOVAL LESIONS BY SNARE N/A 04/05/2023   Procedure:  COLONOSCOPY, FLEXIBLE; WITH REMOVAL OF TUMOR(S), POLYP(S), OR OTHER LESION(S) BY SNARE TECHNIQUE;  Surgeon: Tobie Ronita Burkitt, MD;  Location: [facility];  Service: Gastroenterology;  Laterality: N/A;  . EGD N/A 01/19/2024   Procedure: Upper Endoscopy with Video Capsule (VCE);  Surgeon: Dorris Nat Sours, MD;  Location: [facility] ENDO/[facility];  Service: Gastroenterology;  Laterality: N/A;  . CAPSULE ENDOSCOPY N/A 01/19/2024   Procedure: VCE - VIDEO CAPSULE ENDOSCOPY;  Surgeon: Dorris Nat Sours, MD;  Location: [facility] ENDO/BR[facility];  Service: Gastroenterology;  Laterality: N/A;  . CAPSULE ENDOSCOPY N/A 06/06/2024   Procedure: VCE - VIDEO CAPSULE ENDOSCOPY;  Surgeon: Cyrena Laneta Drown, MD;  Location: [facility] ENDO/[facility];  Service: Gastroenterology;  Laterality: N/A;  . ESOPHAGOGASTRODOUDENOSCOPY W/BIOPSY N/A 06/06/2024   Procedure: ESOPHAGOGASTRODUODENOSCOPY, FLEXIBLE, TRANSORAL; WITH BIOPSY, SINGLE OR MULTIPLE;  Surgeon: Cyrena Laneta Drown, MD;  Location: [facility] ENDO/BRON[facility];  Service: Gastroenterology;  Laterality: N/A;    Allergies Allergies  Allergen Reactions  . Penicillin V Potassium Unknown    Reaction as a child  Other reaction(s): Unknown  Reaction as a child    Medications atorvastatin, cyanocobalamin , doxycycline, fexofenadine , fluticasone  propionate, linaCLOtide, lisinopriL, tamsulosin, and zolpidem  Recent Anticoagulant or Antiplatelet Use The patient is prescribed no previous anticoagulant or antiplatelet agents.  Prophylactic Antibiotics The patient does not require prophylactic antibiotics  Physical Examination BP (!) 142/58   Pulse 75   Temp 36.6 C (97.9 F) (Oral)   Resp 23   Ht 167.6 cm (5' 6)   Wt 60.8 kg (134 lb)   SpO2 99%   BMI 21.63 kg/m  Mental Status: alert and oriented Abdomen: soft, nondistended, nontender  ASSESSMENT AND PLAN  Ryan Bonilla has been evaluated prior to the planned procedure and deemed appropriate to undergo a  Procedure(s): Antegrade Device Assised Enteroscopy.  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  It has resulted in anemia and bouts of symptomatic partial obstruction.  The goal of this procedure will be to reach the site and if reached to bx, tattoo and potentially dilate it as well as retrieve the capsules.  We discussed the potential risks of bleeding and perforation associated with these maneuvers as well as the possibility that I will be unable to reach the site.   [name], MD

## 2024-07-13 NOTE — Progress Notes (Signed)
Pt arrived to recovery via stretcher from procedure. Report received from RN & CRNA. Airway patent, abdomen soft, non-tender, IV patent, denied pain. Pt connected to monitor; VS monitored per protocol. Family brought back to recovery; d/c instructions reviewed.    MD in to assess patient and speak with patient and family regarding procedure and outcomes. IV removed prior to release. Transport ordered. Pt d/c to home, with family member, in stable condition.

## 2024-07-13 NOTE — Progress Notes (Signed)
 Anesthesia Transfer of Care Note  Patient: Ryan Bonilla  Procedures performed: Antegrade Device Assised Enteroscopy ESOPHAGOGASTRODUODENOSCOPY, FLEXIBLE, TRANSORAL; WITH TRANSENDOSCOPIC BALLOON DILATION OF ESOPHAGUS (LESS THAN 30 MM DIAMETER)  Report given/handover to: PACU Nurse  Transported with NCO2 @ 2LPM, spont ventilating well, VSS

## 2024-07-14 ENCOUNTER — Inpatient Hospital Stay

## 2024-07-14 VITALS — BP 116/54 | HR 64 | Temp 98.0°F | Resp 17

## 2024-07-14 DIAGNOSIS — D509 Iron deficiency anemia, unspecified: Secondary | ICD-10-CM

## 2024-07-14 MED ORDER — IRON SUCROSE 20 MG/ML IV SOLN
200.0000 mg | Freq: Once | INTRAVENOUS | Status: AC
  Administered 2024-07-14: 200 mg via INTRAVENOUS
  Filled 2024-07-14: qty 10

## 2024-08-25 ENCOUNTER — Encounter: Payer: Self-pay | Admitting: Oncology

## 2024-09-04 ENCOUNTER — Inpatient Hospital Stay: Attending: Oncology

## 2024-09-04 ENCOUNTER — Other Ambulatory Visit: Payer: Self-pay | Admitting: *Deleted

## 2024-09-04 DIAGNOSIS — D509 Iron deficiency anemia, unspecified: Secondary | ICD-10-CM | POA: Insufficient documentation

## 2024-09-04 LAB — CBC WITH DIFFERENTIAL/PLATELET
Abs Immature Granulocytes: 0.08 K/uL — ABNORMAL HIGH (ref 0.00–0.07)
Basophils Absolute: 0 K/uL (ref 0.0–0.1)
Basophils Relative: 0 %
Eosinophils Absolute: 0.2 K/uL (ref 0.0–0.5)
Eosinophils Relative: 2 %
HCT: 38.6 % — ABNORMAL LOW (ref 39.0–52.0)
Hemoglobin: 13 g/dL (ref 13.0–17.0)
Immature Granulocytes: 1 %
Lymphocytes Relative: 23 %
Lymphs Abs: 1.7 K/uL (ref 0.7–4.0)
MCH: 29.1 pg (ref 26.0–34.0)
MCHC: 33.7 g/dL (ref 30.0–36.0)
MCV: 86.5 fL (ref 80.0–100.0)
Monocytes Absolute: 0.5 K/uL (ref 0.1–1.0)
Monocytes Relative: 7 %
Neutro Abs: 5 K/uL (ref 1.7–7.7)
Neutrophils Relative %: 67 %
Platelets: 318 K/uL (ref 150–400)
RBC: 4.46 MIL/uL (ref 4.22–5.81)
RDW: 16 % — ABNORMAL HIGH (ref 11.5–15.5)
WBC: 7.4 K/uL (ref 4.0–10.5)
nRBC: 0 % (ref 0.0–0.2)

## 2024-09-04 LAB — IRON AND TIBC
Iron: 82 ug/dL (ref 45–182)
Saturation Ratios: 19 % (ref 17.9–39.5)
TIBC: 441 ug/dL (ref 250–450)
UIBC: 359 ug/dL

## 2024-09-04 LAB — FERRITIN: Ferritin: 119 ng/mL (ref 24–336)

## 2024-09-05 ENCOUNTER — Encounter: Payer: Self-pay | Admitting: Oncology

## 2024-09-05 ENCOUNTER — Inpatient Hospital Stay: Admitting: Oncology

## 2024-09-05 ENCOUNTER — Inpatient Hospital Stay

## 2024-09-05 VITALS — BP 138/82 | HR 88 | Temp 98.2°F | Resp 20 | Ht 66.0 in | Wt 132.9 lb

## 2024-09-05 DIAGNOSIS — D509 Iron deficiency anemia, unspecified: Secondary | ICD-10-CM | POA: Diagnosis not present

## 2024-09-05 NOTE — Progress Notes (Unsigned)
 [facility]  [phone] [phone]  ID: Ryan Bonilla OB: July 07, 1956  MR#: [id number]  RDW#:[id number]  Patient Care Team: Rudolpho Norleen BIRCH, MD as PCP - General (Internal Medicine) Jacobo Evalene PARAS, MD as Consulting Physician (Hematology and Oncology)  CHIEF COMPLAINT: Iron  deficiency anemia.  INTERVAL HISTORY: Patient returns to clinic today for repeat laboratory, further evaluation, and continuation of IV iron .  He continues to have fatigue, but admits it has been slightly improved.  He otherwise feels well.  He has no neurologic complaints.  He denies any recent fevers or illnesses.  He has no chest pain, shortness of breath, cough, or hemoptysis.  He denies any nausea, vomiting, constipation, or diarrhea.  He denies any melena or hematochezia.  He has no urinary complaints.  Patient offers no further specific complaints today.  REVIEW OF SYSTEMS:   Review of Systems  Constitutional:  Positive for malaise/fatigue. Negative for fever and weight loss.  Respiratory: Negative.  Negative for cough, hemoptysis and shortness of breath.   Cardiovascular: Negative.  Negative for chest pain and leg swelling.  Gastrointestinal: Negative.  Negative for abdominal pain, blood in stool and melena.  Genitourinary: Negative.  Negative for hematuria.  Musculoskeletal: Negative.  Negative for back pain.  Skin: Negative.  Negative for rash.  Neurological: Negative.  Negative for dizziness, focal weakness, weakness and headaches.  Psychiatric/Behavioral: Negative.  The patient is not nervous/anxious.     As per HPI. Otherwise, a complete review of systems is negative.  PAST MEDICAL HISTORY: Past Medical History:  Diagnosis Date  . Esophageal stricture   . Hyperlipidemia   . Hypertension   . Kidney stones     PAST SURGICAL HISTORY: Past Surgical History:  Procedure Laterality Date  . COLONOSCOPY    . COLONOSCOPY WITH PROPOFOL  N/A 10/10/2015    Procedure: COLONOSCOPY WITH PROPOFOL ;  Surgeon: Deward CINDERELLA Piedmont, MD;  Location: [facility] ENDOSCOPY;  Service: Gastroenterology;  Laterality: N/A;  . ESOPHAGOGASTRODUODENOSCOPY    . ESOPHAGOGASTRODUODENOSCOPY (EGD) WITH PROPOFOL  N/A 10/10/2015   Procedure: ESOPHAGOGASTRODUODENOSCOPY (EGD) WITH PROPOFOL ;  Surgeon: Deward CINDERELLA Piedmont, MD;  Location: [facility] ENDOSCOPY;  Service: Gastroenterology;  Laterality: N/A;    FAMILY HISTORY: Family History  Problem Relation Age of Onset  . Hypertension Father   . Anemia Father     ADVANCED DIRECTIVES (Y/N):  N  HEALTH MAINTENANCE: Social History   Tobacco Use  . Smoking status: Never  . Smokeless tobacco: Never  Substance Use Topics  . Alcohol use: Not Currently    Alcohol/week: 0.0 standard drinks of alcohol  . Drug use: Not Currently     Colonoscopy:  PAP:  Bone density:  Lipid panel:  Allergies  Allergen Reactions  . Penicillins     Current Outpatient Medications  Medication Sig Dispense Refill  . atorvastatin (LIPITOR) 10 MG tablet Take 40 mg by mouth daily.    . fluticasone  (FLONASE ) 50 MCG/ACT nasal spray USE 2 SPRAYS IN EACH  NOSTRIL DAILY 48 g 3  . lisinopril (ZESTRIL) 20 MG tablet Take 20 mg by mouth daily.    . tamsulosin (FLOMAX) 0.4 MG CAPS capsule Take by mouth.    . triamcinolone  cream (KENALOG ) 0.1 % Apply 1 Application topically 2 (two) times daily as needed (Rash). 453.6 g 2  . aspirin EC 81 MG tablet Take 81 mg by mouth daily. Swallow whole. (Patient not taking: Reported on 09/05/2024)    . cyanocobalamin  (,VITAMIN B-12,) 1000 MCG/ML injection Inject 1 ml (1000 units) every 30  days (Patient not taking: Reported on 09/05/2024) 3 mL 3  . Cyanocobalamin  1000 MCG/ML KIT Inject 1 Dose as directed every 30 (thirty) days. (Patient not taking: Reported on 09/05/2024) 1 kit 12  . gemfibrozil (LOPID) 600 MG tablet Take 600 mg by mouth 2 (two) times daily before a meal. (Patient not taking: Reported on 09/05/2024)    . Syringe/Needle, Disp,  (SYRINGE 3CC/25GX1) 25G X 1 3 ML MISC Use to inject Cyanocobalamin  every 30 days (Patient not taking: Reported on 09/05/2024) 3 each 3   No current facility-administered medications for this visit.   Facility-Administered Medications Ordered in Other Visits  Medication Dose Route Frequency Provider Last Rate Last Admin  . 0.9 %  sodium chloride  infusion   Intravenous Continuous Jacobo Evalene PARAS, MD   Stopped at 05/19/23 1356    OBJECTIVE: Vitals:   09/05/24 1435  BP: 138/82  Pulse: 88  Resp: 20  Temp: 98.2 F (36.8 C)  SpO2: 99%     Body mass index is 21.45 kg/m.    ECOG FS:0 - Asymptomatic  General: Well-developed, well-nourished, no acute distress. Eyes: Pink conjunctiva, anicteric sclera. HEENT: Normocephalic, moist mucous membranes. Lungs: No audible wheezing or coughing. Heart: Regular rate and rhythm. Abdomen: Soft, nontender, no obvious distention. Musculoskeletal: No edema, cyanosis, or clubbing. Neuro: Alert, answering all questions appropriately. Cranial nerves grossly intact. Skin: No rashes or petechiae noted. Psych: Normal affect.  LAB RESULTS:  Lab Results  Component Value Date   NA 138 12/18/2019   K 3.9 12/18/2019   CL 104 12/18/2019   CO2 28 12/18/2019   GLUCOSE 70 03/12/2021   BUN 17 12/18/2019   CREATININE 0.93 12/18/2019   CALCIUM 9.5 12/18/2019   PROT 7.9 12/18/2019   ALBUMIN 4.3 12/18/2019   AST 18 12/18/2019   ALT 18 12/18/2019   ALKPHOS 73 12/18/2019   BILITOT 0.7 12/18/2019   GFRNONAA >60 12/18/2019   GFRAA >60 12/18/2019    Lab Results  Component Value Date   WBC 7.4 09/04/2024   NEUTROABS 5.0 09/04/2024   HGB 13.0 09/04/2024   HCT 38.6 (L) 09/04/2024   MCV 86.5 09/04/2024   PLT 318 09/04/2024   Lab Results  Component Value Date   IRON  82 09/04/2024   TIBC 441 09/04/2024   IRONPCTSAT 19 09/04/2024   Lab Results  Component Value Date   FERRITIN 119 09/04/2024     STUDIES: No results found.   ASSESSMENT: Iron   deficiency anemia.  PLAN:    Iron  deficiency anemia: Patient's hemoglobin has improved to 12.1, but he still has reduced iron  stores and is mildly symptomatic.  Patient had EGD at [facility] in March 2025 patient reports no significant pathology.  He reports video endoscopy x 2, but camera was never retrieved given a stricture in his small intestine.  Patient reports he is having a push endoscopy in the near future to retrieve the cameras and to assess for any lesions.  Proceed with 200 mg IV Venofer  today.  Return to clinic 3 times over the next 1 to 2 weeks for additional treatment.  Patient will then return to clinic in 2 months with repeat laboratory work, further evaluation, and continuation of treatment if necessary.    I spent a total of 30 minutes reviewing chart data, face-to-face evaluation with the patient, counseling and coordination of care as detailed above.    Patient expressed understanding and was in agreement with this plan. He also understands that He can call clinic at any  time with any questions, concerns, or complaints.     Evalene JINNY Reusing, MD   09/05/2024 2:46 PM

## 2024-09-07 ENCOUNTER — Encounter: Payer: Self-pay | Admitting: Oncology

## 2024-12-04 ENCOUNTER — Ambulatory Visit: Admitting: Physician Assistant

## 2025-01-03 ENCOUNTER — Inpatient Hospital Stay

## 2025-01-04 ENCOUNTER — Inpatient Hospital Stay

## 2025-01-04 ENCOUNTER — Inpatient Hospital Stay: Admitting: Oncology

## 2025-06-04 ENCOUNTER — Ambulatory Visit: Admitting: Physician Assistant
